# Patient Record
Sex: Female | Born: 2000 | Race: Black or African American | Hispanic: No | Marital: Single | Smoking: Never smoker
Health system: Southern US, Community
[De-identification: ages and names within clinical notes are randomized; demographics above are authoritative.]

## PROBLEM LIST (undated history)

## (undated) DIAGNOSIS — O99013 Anemia complicating pregnancy, third trimester: Secondary | ICD-10-CM

## (undated) DIAGNOSIS — Z8759 Personal history of other complications of pregnancy, childbirth and the puerperium: Secondary | ICD-10-CM

## (undated) DIAGNOSIS — R000 Tachycardia, unspecified: Secondary | ICD-10-CM

## (undated) DIAGNOSIS — F909 Attention-deficit hyperactivity disorder, unspecified type: Secondary | ICD-10-CM

## (undated) DIAGNOSIS — E301 Precocious puberty: Secondary | ICD-10-CM

## (undated) DIAGNOSIS — F4310 Post-traumatic stress disorder, unspecified: Secondary | ICD-10-CM

## (undated) HISTORY — DX: Personal history of other complications of pregnancy, childbirth and the puerperium: Z87.59

## (undated) HISTORY — DX: Post-traumatic stress disorder, unspecified: F43.10

## (undated) HISTORY — PX: WISDOM TOOTH EXTRACTION: SHX21

## (undated) HISTORY — DX: Attention-deficit hyperactivity disorder, unspecified type: F90.9

## (undated) HISTORY — DX: Precocious puberty: E30.1

---
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
## 2020-02-07 ENCOUNTER — Ambulatory Visit: Payer: Medicaid Other

## 2020-02-18 ENCOUNTER — Encounter: Payer: Self-pay | Admitting: Family Medicine

## 2020-02-18 ENCOUNTER — Other Ambulatory Visit: Payer: Self-pay

## 2020-02-18 ENCOUNTER — Ambulatory Visit: Payer: Medicaid Other | Admitting: Family Medicine

## 2020-02-18 DIAGNOSIS — Z113 Encounter for screening for infections with a predominantly sexual mode of transmission: Secondary | ICD-10-CM

## 2020-02-18 DIAGNOSIS — N760 Acute vaginitis: Secondary | ICD-10-CM | POA: Diagnosis not present

## 2020-02-18 DIAGNOSIS — B9689 Other specified bacterial agents as the cause of diseases classified elsewhere: Secondary | ICD-10-CM

## 2020-02-18 LAB — WET PREP FOR TRICH, YEAST, CLUE
Trichomonas Exam: NEGATIVE
Yeast Exam: NEGATIVE

## 2020-02-18 MED ORDER — METRONIDAZOLE 500 MG PO TABS: 500.0000 mg | ORAL_TABLET | Freq: Two times a day (BID) | ORAL | 0 refills | Status: AC

## 2020-02-18 NOTE — Progress Notes (Signed)
[facility] Department STI clinic/screening visit  Subjective:  Denise [name] is a 19 y.o. female being seen today for an STI screening visit. The patient reports they do not have symptoms.  Patient reports that they do not desire a pregnancy in the next year.   They reported they are not interested in discussing contraception today.  Patient's last menstrual period was 02/10/2020 (exact date).   Patient has the following medical conditions:   Patient Active Problem List   Diagnosis Date Noted  . Elevated blood pressure reading without diagnosis of hypertension 09/18/2018  . Post traumatic stress disorder (PTSD) 02/20/2014  . 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 ADHD (attention deficit hyperactivity disorder), 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    Chief Complaint  Patient presents with  . SEXUALLY TRANSMITTED DISEASE    screening     HPI  Patient reports wanting to be checked to make sure that previous infection is gone.    Last HIV test per patient/review of record was 08/21/2019 No previous pap for patient d/t age.  Discussed age of first pap should be at 52.  .   See flowsheet for further details and programmatic requirements.    The following portions of the patient's history were reviewed and updated as appropriate: allergies, current medications, past medical history, past social history, past surgical history and problem list.  Objective:  There were no vitals filed for this visit.  Physical Exam Constitutional:      Appearance: She is obese.  HENT:     Head: Normocephalic.     Mouth/Throat:     Mouth: Mucous membranes are moist.     Pharynx: Oropharynx is clear.  Abdominal:     Palpations: Abdomen is soft. There is no mass.     Tenderness: There is no abdominal tenderness. There is no guarding.  Genitourinary:    Comments: External genitalia without, lice, nits, erythema, edema , lesions or inguinal adenopathy. Vagina with normal  mucosa and discharge and pH equals 4.  Cervix without visual lesions, uterus firm, mobile, non-tender, no masses, CMT adnexal fullness or tenderness.  Musculoskeletal:     Cervical back: Normal range of motion and neck supple.  Lymphadenopathy:     Cervical: No cervical adenopathy.  Skin:    General: Skin is warm and dry.     Findings: No erythema, lesion or rash.  Neurological:     Mental Status: She is alert.  Psychiatric:        Mood and Affect: Mood normal.        Behavior: Behavior normal.      Assessment and Plan:  Denise [name] is a 19 y.o. female presenting to the [facility] Department for STI screening  1. Screening examination for venereal disease  - Chlamydia/Gonorrhea [place] [facility] - WET PREP FOR TRICH, YEAST, CLUE - Chlamydia/Gonorrhea [place] [facility]  Patient accepted screenings including wet prep, oral and vaginal CT/GC NAAT.   Patient meets criteria for HepB screening? No. Ordered? No - N/A Patient meets criteria for HepC screening? No. Ordered? No - N/A  Treat wet prep per standing order with metronidazole 500 mg PO BID x 7 days.  Discussed time line for [facility] results and that patient will be called with positive results and encouraged patient to call if she had not heard in 2 weeks.  Counseled to return or seek care for continued or worsening symptoms Recommended condom use with all sex  Patient is currently using condoms  to prevent pregnancy and is not interested in birth control.      No follow-ups on file.  No future appointments.  Wendi Snipes, RN

## 2020-03-01 NOTE — Progress Notes (Signed)
Reviewed ERRN documentation for flowsheet and exam note.  Agree that documentation meets STD program requirements/guidelines and standing orders.  Will sign since [facility] is not cleared to sign documents yet.

## 2020-06-26 ENCOUNTER — Other Ambulatory Visit: Payer: Self-pay

## 2020-06-26 ENCOUNTER — Ambulatory Visit: Payer: Medicaid Other | Admitting: Physician Assistant

## 2020-06-26 DIAGNOSIS — Z113 Encounter for screening for infections with a predominantly sexual mode of transmission: Secondary | ICD-10-CM

## 2020-06-26 LAB — WET PREP FOR TRICH, YEAST, CLUE
Trichomonas Exam: NEGATIVE
Yeast Exam: NEGATIVE

## 2020-06-26 NOTE — Progress Notes (Unsigned)
Wet mount reviewed with provider and no treatment needed per standing order and per provider verbal order as pt reports not having any symptoms. Upon posting pt reports she had some white discharge but no other symptoms, so consulted with Sadie Haber, PA who states that white discharge can be normal and that sometimes hormonal changes prior to getting ready to start your menstrual period could cause that as well but that no treatment needed at this time. Counseled pt per Sadie Haber, PA and pt states understanding. Counseled pt that if any abnormal symptoms develop or if discharge increases or doesn't go away to let us know so she can RTC, and pt states understanding. Awaiting TR's. Provider orders completed.

## 2020-06-27 ENCOUNTER — Encounter: Payer: Self-pay | Admitting: Physician Assistant

## 2020-06-27 NOTE — Progress Notes (Signed)
  [facility] Department STI clinic/screening visit  Subjective:  Denise [name] is a 20 y.o. female being seen today for an STI screening visit. The patient reports they do not have symptoms.  Patient reports that they do not desire a pregnancy in the next year.   They reported they are not interested in discussing contraception today.  Patient's last menstrual period was 05/31/2020.   Patient has the following medical conditions:   Patient Active Problem List   Diagnosis Date Noted  . Elevated blood pressure reading without diagnosis of hypertension 09/18/2018  . Post traumatic stress disorder (PTSD) 02/20/2014  . 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 ADHD (attention deficit hyperactivity disorder), 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    Chief Complaint  Patient presents with  . SEXUALLY TRANSMITTED DISEASE    screening    HPI  Patient reports that she is not having any symptoms but would like a screening today.  Denies surgeries and regular medicines.  Reports history of ADHD and PTSD but denies any current symptoms or medications.  States last HIV test was in 2021 and has not had a pap since she is not yet 20 years old.  See flowsheet for further details and programmatic requirements.    The following portions of the patient's history were reviewed and updated as appropriate: allergies, current medications, past medical history, past social history, past surgical history and problem list.  Objective:  There were no vitals filed for this visit.  Physical Exam Constitutional:      General: She is not in acute distress.    Appearance: Normal appearance.  HENT:     Head: Normocephalic and atraumatic.  Eyes:     Conjunctiva/sclera: Conjunctivae normal.  Pulmonary:     Effort: Pulmonary effort is normal.  Skin:    General: Skin is warm and dry.     Findings: No bruising, erythema, lesion or rash.  Neurological:     Mental Status: She is alert and  oriented to person, place, and time.  Psychiatric:        Mood and Affect: Mood normal.        Behavior: Behavior normal.        Thought Content: Thought content normal.        Judgment: Judgment normal.      Assessment and Plan:  BLESS LISENBY is a 20 y.o. female presenting to the [facility] Department for STI screening  1. Screening for STD (sexually transmitted disease) Patient into clinic without symptoms. Patient declines screening exam by provider and opts to self-collect vaginal samples.  Counseled patient how to collect for accurate results. Rec condoms with all sex. Await test results.  Counseled that RN will call if needs to RTC for treatment once results are back. - WET PREP FOR TRICH, YEAST, CLUE - Chlamydia/Gonorrhea [facility] - HIV [place] [facility] - Syphilis Serology, [facility]     Return if symptoms worsen or fail to improve.  No future appointments.  Matt Holmes, PA

## 2020-07-03 ENCOUNTER — Encounter: Payer: Self-pay | Admitting: Student

## 2020-07-03 LAB — HM HIV SCREENING LAB: HM HIV Screening: NEGATIVE

## 2020-08-15 ENCOUNTER — Ambulatory Visit
Admission: EM | Admit: 2020-08-15 | Discharge: 2020-08-15 | Disposition: A | Discharge status: Home / Self Care | Payer: Medicaid Other | Attending: Emergency Medicine | Admitting: Emergency Medicine

## 2020-08-15 ENCOUNTER — Other Ambulatory Visit: Payer: Self-pay

## 2020-08-15 ENCOUNTER — Telehealth: Payer: Self-pay

## 2020-08-15 DIAGNOSIS — N760 Acute vaginitis: Secondary | ICD-10-CM

## 2020-08-15 MED ORDER — NYSTATIN-TRIAMCINOLONE 100000-0.1 UNIT/GM-% EX CREA: TOPICAL_CREAM | CUTANEOUS | 0 refills | Status: DC

## 2020-08-15 NOTE — ED Triage Notes (Signed)
Pt c/o milky white discharge with fishy odor for approx 1 month, vaginal irritation and itching for past two weeks Was evaluated and tested at health dept approx 12 days ago and reports all testing was negative. Had blood drawn for HIV, syphilis (negative).   Has been using boric acid and monistat for approx 1 week. Improved symptoms, but stopped boric acid yesterday 2/2 bleeding. Denies abdominal pain, fever, n/v/d

## 2020-08-15 NOTE — ED Provider Notes (Signed)
[name]    CSN: [id number] Arrival date & time: 08/15/20  1140      History   Chief Complaint Chief Complaint  Patient presents with  . Vaginal Discharge    HPI Denise [name] is a 20 y.o. female.   Denise [name] is a 20 year old female with complaint of vaginal discharge and odor for about 1 month.  Patient reports that she was tested for STIs about 2 weeks ago and results were negative. Patient reports using some OTC fungal cream and Boric suppository and was able to get symptom relief.  Patient reports smell has resolved but still having some itching and irritation.   The history is provided by the patient.  Vaginal Discharge Associated symptoms: no abdominal pain, no fever, no nausea and no vomiting     Past Medical History:  Diagnosis Date  . Premature baby    born at 68 weeks  . Premature puberty    mother started menses at 64yrs.    Patient Active Problem List   Diagnosis Date Noted  . Elevated blood pressure reading without diagnosis of hypertension 09/18/2018  . Post traumatic stress disorder (PTSD) 02/20/2014  . 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 ADHD (attention deficit hyperactivity disorder), 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    History reviewed. No pertinent surgical history.  OB History   No obstetric history on file.      Home Medications    Prior to Admission medications   Medication Sig Start Date End Date Taking? Authorizing Provider  nystatin-triamcinolone (MYCOLOG II) cream Apply to affected area once or twice a day as needed 08/15/20  Yes Ivette Loyal, NP    Family History Family History  Problem Relation Age of Onset  . Bipolar disorder Mother   . Enuresis Sister        nocturnal    Social History Social History   Tobacco Use  . Smoking status: Never Smoker  . Smokeless tobacco: Never Used  Vaping Use  . Vaping Use: Never used  Substance Use Topics  . Alcohol use: Yes    Alcohol/week:  2.0 standard drinks    Types: 2 Shots of liquor per week    Comment: every now and then   . Drug use: No     Allergies   Peanut-containing drug products   Review of Systems Review of Systems  Constitutional: Negative for fever.  Gastrointestinal: Negative for abdominal pain, nausea and vomiting.  Genitourinary: Positive for vaginal discharge. Negative for flank pain, genital sores, hematuria, pelvic pain and urgency.     Physical Exam Triage Vital Signs ED Triage Vitals  Enc Vitals Group     BP 08/15/20 1157 127/86     Pulse Rate 08/15/20 1157 (!) 106     Resp 08/15/20 1157 18     Temp 08/15/20 1157 99.3 F (37.4 C)     Temp Source 08/15/20 1157 Oral     SpO2 08/15/20 1157 97 %     Weight --      Height --      Head Circumference --      Peak Flow --      Pain Score 08/15/20 1158 0     Pain Loc --      Pain Edu? --      Excl. in GC? --    No data found.  Updated Vital Signs BP 127/86 (BP Location: Left Arm)   Pulse (!) 106   Temp 99.3  F (37.4 C) (Oral)   Resp 18   LMP 07/26/2020   SpO2 97%   Visual Acuity Right Eye Distance:   Left Eye Distance:   Bilateral Distance:    Right Eye Near:   Left Eye Near:    Bilateral Near:     Physical Exam Vitals and nursing note reviewed.  Constitutional:      General: She is not in acute distress.    Appearance: She is not ill-appearing or diaphoretic.  HENT:     Head: Normocephalic and atraumatic.  Eyes:     Conjunctiva/sclera: Conjunctivae normal.  Cardiovascular:     Rate and Rhythm: Normal rate.     Pulses: Normal pulses.  Pulmonary:     Effort: Pulmonary effort is normal.  Abdominal:     Palpations: Abdomen is soft.     Tenderness: There is no right CVA tenderness or left CVA tenderness.  Musculoskeletal:        General: Normal range of motion.     Cervical back: Normal range of motion.  Skin:    General: Skin is warm and dry.  Neurological:     Mental Status: She is alert and oriented to  person, place, and time.  Psychiatric:        Mood and Affect: Mood normal.      UC Treatments / Results  Labs (all labs ordered are listed, but only abnormal results are displayed) Labs Reviewed  CERVICOVAGINAL ANCILLARY ONLY    EKG   Radiology No results found.  Procedures Procedures (including critical care time)  Medications Ordered in UC Medications - No data to display  Initial Impression / Assessment and Plan / UC Course  I have reviewed the triage vital signs and the nursing notes.  Pertinent labs & imaging results that were available during my care of the patient were reviewed by me and considered in my medical decision making (see chart for details).     Vaginitis.   Assessment negative for red flags. Self vaginal swab pending.  Will treat based on results.   Mycolog cream as needed for symptom management.    Final Clinical Impressions(s) / UC Diagnoses   Final diagnoses:  Vaginitis and vulvovaginitis     Discharge Instructions     You have vaginitis.   We will contact you if your labs come back positive.   You may use Mycolog cream as needed for itch and irritation.   Return or go to the Emergency Department if symptoms worsen or do not improve in the next few days.      ED Prescriptions    Medication Sig Dispense Auth. Provider   nystatin-triamcinolone (MYCOLOG II) cream Apply to affected area once or twice a day as needed 15 g Ivette Loyal, NP     PDMP not reviewed this encounter.   Ivette Loyal, NP 08/15/20 1227

## 2020-08-15 NOTE — Telephone Encounter (Signed)
Pharmacy requested prior auth for mycolog cream. Per Karie Schwalbe, NP, pharmacy to dispense nystatin cream and triamcinolone creams separately and apply both once per day. New Rx info given to [facility] of [facility] [address]

## 2020-08-15 NOTE — Discharge Instructions (Addendum)
You have vaginitis.   We will contact you if your labs come back positive.   You may use Mycolog cream as needed for itch and irritation.   Return or go to the Emergency Department if symptoms worsen or do not improve in the next few days.

## 2020-08-18 ENCOUNTER — Ambulatory Visit: Payer: Medicaid Other

## 2020-08-18 ENCOUNTER — Telehealth: Payer: Self-pay

## 2020-08-18 ENCOUNTER — Telehealth (HOSPITAL_COMMUNITY): Payer: Self-pay | Admitting: Emergency Medicine

## 2020-08-18 LAB — CERVICOVAGINAL ANCILLARY ONLY
Bacterial Vaginitis (gardnerella): POSITIVE — AB
Candida Glabrata: NEGATIVE
Candida Vaginitis: NEGATIVE
Chlamydia: NEGATIVE
Comment: NEGATIVE
Comment: NEGATIVE
Comment: NEGATIVE
Comment: NEGATIVE
Comment: NEGATIVE
Comment: NORMAL
Neisseria Gonorrhea: NEGATIVE
Trichomonas: NEGATIVE

## 2020-08-18 MED ORDER — METRONIDAZOLE 500 MG PO TABS: 500.0000 mg | ORAL_TABLET | Freq: Two times a day (BID) | ORAL | 0 refills | Status: DC

## 2020-08-18 MED ORDER — FLUCONAZOLE 150 MG PO TABS: ORAL_TABLET | ORAL | 0 refills | Status: DC

## 2020-08-18 NOTE — Telephone Encounter (Signed)
Pt called for results cyto swab. Notified pt she is positive for BV and we can send Rx to pharmacy. Pt also requested Diflucan 2/2 prone to yeast infection with abx.   Per T. Jaci Lazier, NP, Rx sent to [facility] [address] [employer]

## 2020-09-15 ENCOUNTER — Ambulatory Visit: Payer: Medicaid Other

## 2020-10-23 ENCOUNTER — Encounter: Payer: Self-pay | Admitting: Emergency Medicine

## 2020-10-23 ENCOUNTER — Ambulatory Visit
Admission: EM | Admit: 2020-10-23 | Discharge: 2020-10-23 | Disposition: A | Discharge status: Home / Self Care | Payer: Medicaid Other

## 2020-10-23 ENCOUNTER — Other Ambulatory Visit: Payer: Self-pay

## 2020-10-23 DIAGNOSIS — B349 Viral infection, unspecified: Secondary | ICD-10-CM

## 2020-10-23 MED ORDER — BENZONATATE 100 MG PO CAPS: 100.0000 mg | ORAL_CAPSULE | Freq: Three times a day (TID) | ORAL | 0 refills | Status: DC | PRN

## 2020-10-23 NOTE — ED Provider Notes (Signed)
[name]    CSN: [id number] Arrival date & time: 10/23/20  1416      History   Chief Complaint Chief Complaint  Patient presents with  . Cough    HPI Denise [name] is a 20 y.o. female.   Patient presents with 1 week history of congestion, postnasal drip, runny nose, headache, nonproductive cough.  She also reports chills at the onset of her symptoms and believes she had a fever but she did not take her temperature with a thermometer.  She also reports intermittent diarrhea over the past week; 3 episodes today.  She denies rash, shortness of breath, vomiting, or other symptoms.  No treatments attempted at home.  Her medical history includes ADHD, conduct disorder, PTSD, elevated blood pressure reading.  The history is provided by the patient and medical records.    Past Medical History:  Diagnosis Date  . Premature baby    born at 102 weeks  . Premature puberty    mother started menses at 62yrs.    Patient Active Problem List   Diagnosis Date Noted  . Elevated blood pressure reading without diagnosis of hypertension 09/18/2018  . Post traumatic stress disorder (PTSD) 02/20/2014  . 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 ADHD (attention deficit hyperactivity disorder), 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    History reviewed. No pertinent surgical history.  OB History   No obstetric history on file.      Home Medications    Prior to Admission medications   Medication Sig Start Date End Date Taking? Authorizing Provider  benzonatate (TESSALON) 100 MG capsule Take 1 capsule (100 mg total) by mouth 3 (three) times daily as needed for cough. 10/23/20  Yes Mickie Bail, NP  guaiFENesin (MUCINEX) 600 MG 12 hr tablet Take by mouth 2 (two) times daily.   Yes [provider]  fluconazole (DIFLUCAN) 150 MG tablet Take one tablet today, then repeat in 7 days Patient not taking: Reported on 10/23/2020 08/18/20   Dahlia Byes A, NP  metroNIDAZOLE (FLAGYL)  500 MG tablet Take 1 tablet (500 mg total) by mouth 2 (two) times daily. Patient not taking: Reported on 10/23/2020 08/18/20   Janace Aris, NP  nystatin-triamcinolone (MYCOLOG II) cream Apply to affected area once or twice a day as needed Patient not taking: Reported on 10/23/2020 08/15/20   Ivette Loyal, NP    Family History Family History  Problem Relation Age of Onset  . Bipolar disorder Mother   . Enuresis Sister        nocturnal    Social History Social History   Tobacco Use  . Smoking status: Never Smoker  . Smokeless tobacco: Never Used  Vaping Use  . Vaping Use: Never used  Substance Use Topics  . Alcohol use: Yes    Alcohol/week: 2.0 standard drinks    Types: 2 Shots of liquor per week    Comment: every now and then   . Drug use: No     Allergies   Peanut-containing drug products   Review of Systems Review of Systems  Constitutional: Positive for chills and fever.  HENT: Positive for congestion, postnasal drip and rhinorrhea. Negative for ear pain and sore throat.   Respiratory: Positive for cough. Negative for shortness of breath.   Cardiovascular: Negative for chest pain and palpitations.  Gastrointestinal: Positive for diarrhea. Negative for abdominal pain, nausea and vomiting.  Musculoskeletal: Negative for arthralgias and back pain.  Skin: Negative for color change and rash.  Neurological: Positive for headaches. Negative for dizziness, syncope, weakness and numbness.  All other systems reviewed and are negative.    Physical Exam Triage Vital Signs ED Triage Vitals  Enc Vitals Group     BP      Pulse      Resp      Temp      Temp src      SpO2      Weight      Height      Head Circumference      Peak Flow      Pain Score      Pain Loc      Pain Edu?      Excl. in GC?    No data found.  Updated Vital Signs BP 137/87 (BP Location: Left Arm)   Pulse 91   Temp 98.9 F (37.2 C) (Oral)   Resp 20   LMP 10/17/2020   SpO2 96%   Visual  Acuity Right Eye Distance:   Left Eye Distance:   Bilateral Distance:    Right Eye Near:   Left Eye Near:    Bilateral Near:     Physical Exam Vitals and nursing note reviewed.  Constitutional:      General: She is not in acute distress.    Appearance: She is well-developed. She is not ill-appearing.  HENT:     Head: Normocephalic and atraumatic.     Right Ear: Tympanic membrane normal.     Left Ear: Tympanic membrane normal.     Nose: Nose normal.     Mouth/Throat:     Mouth: Mucous membranes are moist.     Pharynx: Oropharynx is clear.  Eyes:     Conjunctiva/sclera: Conjunctivae normal.  Cardiovascular:     Rate and Rhythm: Normal rate and regular rhythm.     Heart sounds: Normal heart sounds.  Pulmonary:     Effort: Pulmonary effort is normal. No respiratory distress.     Breath sounds: Normal breath sounds.  Abdominal:     Palpations: Abdomen is soft.     Tenderness: There is no abdominal tenderness. There is no guarding or rebound.  Musculoskeletal:     Cervical back: Neck supple.  Skin:    General: Skin is warm and dry.  Neurological:     General: No focal deficit present.     Mental Status: She is alert and oriented to person, place, and time.  Psychiatric:        Mood and Affect: Mood normal.        Behavior: Behavior normal.      UC Treatments / Results  Labs (all labs ordered are listed, but only abnormal results are displayed) Labs Reviewed  NOVEL CORONAVIRUS, NAA    EKG   Radiology No results found.  Procedures Procedures (including critical care time)  Medications Ordered in UC Medications - No data to display  Initial Impression / Assessment and Plan / UC Course  I have reviewed the triage vital signs and the nursing notes.  Pertinent labs & imaging results that were available during my care of the patient were reviewed by me and considered in my medical decision making (see chart for details).   Viral illness.  COVID pending.   Instructed patient to self quarantine until the test result is back.  Discussed symptomatic treatment including Tessalon as needed for cough, Tylenol or ibuprofen as needed for fever or discomfort, rest, hydration.  Instructed patient to follow up with  PCP if her symptoms are not improving.  Patient agrees to plan of care.    Final Clinical Impressions(s) / UC Diagnoses   Final diagnoses:  Viral illness     Discharge Instructions     Your COVID test is pending.  You should self quarantine until the test result is back.    Take Tessalon Perles as needed for cough.  Take Tylenol or ibuprofen as needed for fever or discomfort.  Rest and keep yourself hydrated.    Follow-up with your primary care provider if your symptoms are not improving.        ED Prescriptions    Medication Sig Dispense Auth. Provider   benzonatate (TESSALON) 100 MG capsule Take 1 capsule (100 mg total) by mouth 3 (three) times daily as needed for cough. 21 capsule Mickie Bail, NP     PDMP not reviewed this encounter.   Mickie Bail, NP 10/23/20 [phone]

## 2020-10-23 NOTE — Discharge Instructions (Signed)
Your COVID test is pending.  You should self quarantine until the test result is back.    Take Tessalon Perles as needed for cough.  Take Tylenol or ibuprofen as needed for fever or discomfort.  Rest and keep yourself hydrated.    Follow-up with your primary care provider if your symptoms are not improving.

## 2020-10-23 NOTE — ED Triage Notes (Signed)
Patient says symptoms started one week ago.  Complains of cough, congestion, headache, and initially had a fever

## 2020-10-24 LAB — SARS-COV-2, NAA 2 DAY TAT

## 2020-10-24 LAB — NOVEL CORONAVIRUS, NAA: SARS-CoV-2, NAA: DETECTED — AB

## 2021-01-06 ENCOUNTER — Other Ambulatory Visit: Payer: Self-pay

## 2021-01-06 ENCOUNTER — Ambulatory Visit
Admission: EM | Admit: 2021-01-06 | Discharge: 2021-01-06 | Disposition: A | Discharge status: Home / Self Care | Payer: Medicaid Other | Attending: Emergency Medicine | Admitting: Emergency Medicine

## 2021-01-06 ENCOUNTER — Encounter: Payer: Self-pay | Admitting: Emergency Medicine

## 2021-01-06 DIAGNOSIS — L02215 Cutaneous abscess of perineum: Secondary | ICD-10-CM

## 2021-01-06 MED ORDER — DOXYCYCLINE HYCLATE 100 MG PO CAPS: 100.0000 mg | ORAL_CAPSULE | Freq: Two times a day (BID) | ORAL | 0 refills | Status: AC

## 2021-01-06 NOTE — Discharge Instructions (Addendum)
Take antibiotic with food as prescribed. Apply warm compresses to promote drainage. Warm Epsom salt soaks as discussed. Keep covered until wound closes. Expect it to gradually improve over 7 - 10 days.  Watch for signs of worsening infection: increased redness, swelling, red streaking, fever, or worsening pain. If these symptoms are present, or if you have new or concerning symptoms, return to clinic immediately for a recheck.

## 2021-01-06 NOTE — ED Provider Notes (Signed)
Chief Complaint   Chief Complaint  Patient presents with   Rash     Subjective, HPI  QUANIYAH BUGH is a very pleasant 20 y.o. female who presents with "bump" to her mid buttock region for the last 3 days.  Patient reports that she recently changed soaps and has noticed some burning in the area.  Patient reports some soreness and the appearance of "yellow at the top".  Patient states that at times it is a bit painful to sit.  She denies any fever, vomiting, chills.  She does not report any new sexual partners or vaginal discharge.  History obtained from patient.   Patient's problem list, past medical and social history, medications, and allergies were reviewed by me and updated in Epic.    ROS  See HPI.  Objective   Vitals:   01/06/21 0926  BP: 113/77  Pulse: 92  Resp: 18  Temp: 98.8 F (37.1 C)  SpO2: 98%    Vital signs and nursing note reviewed.  General: Appears well-developed and well-nourished. No acute distress.  HEENT: Normocephalic, atraumatic, hearing grossly intact. EOMI, no drainage. No rhinorrhea. Moist mucous membranes.  Neck: Normal range of motion, neck is supple.  Cardiovascular: Normal rate.  Pulm/Chest: No respiratory distress.   Musculoskeletal: No joint deformity, normal range of motion.  Skin: Superficial small abscess noted to perineum.  Mild TTP over area.  No induration.  Data  No results found for any visits on 01/06/21.   Assessment & Plan  1. Perineal abscess, superficial  Meds ordered this encounter  Medications   doxycycline (VIBRAMYCIN) 100 MG capsule    Sig: Take 1 capsule (100 mg total) by mouth 2 (two) times daily for 10 days.    Dispense:  20 capsule    Refill:  0    Order Specific Question:   Supervising Provider    Answer:   Merrilee Jansky [[id number]]     20 y.o. female presents with "bump" to her mid buttock region for the last 3 days.  Patient reports that she recently changed soaps and has noticed some burning in the  area.  Patient reports some soreness and the appearance of "yellow at the top".  Patient states that at times it is a bit painful to sit.  She denies any fever, vomiting, chills.  She does not report any new sexual partners or vaginal discharge.  Chart review completed.  Given symptoms along with assessment findings, likely small abscess to the perineum.  Advised about home treatment and care to include Epsom salt soaks and warm compresses.  Also Rx doxycycline to the patient's preferred pharmacy.  Advised that if area does not improve or acutely worsens over the next 7 to 10 days she will need to return to clinic for recheck.  Patient verbalized understanding and agreed with plan.  Patient stable upon discharge.  Return as needed.  Plan:   Discharge Instructions      Take antibiotic with food as prescribed. Apply warm compresses to promote drainage. Warm Epsom salt soaks as discussed. Keep covered until wound closes. Expect it to gradually improve over 7 - 10 days.  Watch for signs of worsening infection: increased redness, swelling, red streaking, fever, or worsening pain. If these symptoms are present, or if you have new or concerning symptoms, return to clinic immediately for a recheck.        Amalia Greenhouse, FNP-C 01/06/21  This note was partially made with the aid of speech-to-text dictation; typographical  errors are not intentional.    Amalia Greenhouse, [place] 01/06/21 [phone]

## 2021-01-06 NOTE — ED Triage Notes (Signed)
Pt presents today with c/o of rash to mid buttocks x 3 days. Denies fever.

## 2021-02-09 ENCOUNTER — Ambulatory Visit: Payer: Medicaid Other

## 2021-03-03 ENCOUNTER — Encounter: Payer: Self-pay | Admitting: Advanced Practice Midwife

## 2021-03-03 ENCOUNTER — Ambulatory Visit: Payer: Medicaid Other | Admitting: Advanced Practice Midwife

## 2021-03-03 ENCOUNTER — Other Ambulatory Visit: Payer: Self-pay

## 2021-03-03 DIAGNOSIS — Z113 Encounter for screening for infections with a predominantly sexual mode of transmission: Secondary | ICD-10-CM | POA: Diagnosis not present

## 2021-03-03 LAB — WET PREP FOR TRICH, YEAST, CLUE
Trichomonas Exam: NEGATIVE
Yeast Exam: NEGATIVE

## 2021-03-03 NOTE — Progress Notes (Signed)
[facility] Department STI clinic/screening visit  Subjective:  Denise [name] is a 20 y.o. SBF nullip nonsmoker female being seen today for an STI screening visit. The patient reports they do not have symptoms.  Patient reports that they do not desire a pregnancy in the next year.   They reported they are not interested in discussing contraception today.  No LMP recorded.   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 Last sex 02/26/21 without condom; with current partner x 1.5 years; 1 partner in last 3 mo. LMP 02/27/21. Last ETOH 02/23/21 (1 wine cooler) q 3 mo.  Last HIV test per patient/review of record was 07/03/20 Patient reports last pap was never  See flowsheet for further details and programmatic requirements.    The following portions of the patient's history were reviewed and updated as appropriate: allergies, current medications, past medical history, past social history, past surgical history and problem list.  Objective:  There were no vitals filed for this visit.  Physical Exam Vitals and nursing note reviewed.  Constitutional:      Appearance: Normal appearance. She is obese.  HENT:     Head: Normocephalic and atraumatic.     Mouth/Throat:     Mouth: Mucous membranes are moist.     Pharynx: Oropharynx is clear. No oropharyngeal exudate or posterior oropharyngeal erythema.  Eyes:     Conjunctiva/sclera: Conjunctivae normal.  Pulmonary:     Effort: Pulmonary effort is normal.   Abdominal:     Palpations: Abdomen is soft. There is no mass.     Tenderness: There is no abdominal tenderness. There is no rebound.     Comments: Increased adipose, poor tone, soft without masses or tenderness  Genitourinary:    General: Normal vulva.     Exam position: Lithotomy position.     Pubic Area: No rash or pubic lice.      Labia:        Right: No rash or lesion.        Left: No rash or lesion.      Vagina: Vaginal discharge (pink leukorrhea, ph>4.5) present. No erythema, bleeding or lesions.     Cervix: Normal.     Rectum: Normal.  Lymphadenopathy:     Head:     Right side of head: No preauricular or posterior auricular adenopathy.     Left side of head: No preauricular or posterior auricular adenopathy.     Cervical: No cervical adenopathy.     Right cervical: No superficial, deep or posterior cervical adenopathy.    Left cervical: No superficial, deep or posterior cervical adenopathy.     Upper Body:     Right upper body: No supraclavicular or axillary adenopathy.     Left upper body: No supraclavicular or axillary adenopathy.     Lower Body: No right inguinal adenopathy. No left inguinal adenopathy.  Skin:    General: Skin is warm and dry.     Findings: No rash.  Neurological:     Mental Status: She is alert and oriented to  person, place, and time.     Assessment and Plan:  HEILEY SHAIKH is a 20 y.o. female presenting to the [facility] Department for STI screening  1. Screening examination for venereal disease Treat wet mount per standing orders Immunization nurse consult - WET PREP FOR TRICH, YEAST, CLUE - Gonococcus culture - Chlamydia/Gonorrhea [facility] - Syphilis Serology, [facility] - HIV  LAB  2. Morbid obesity (HCC)      No follow-ups on file.  No future appointments.  Alberteen Spindle, CNM

## 2021-03-03 NOTE — Progress Notes (Signed)
Patient here for STD testing. Wet mount reviewed, no tx per standing orders. Declined condoms.

## 2021-03-08 LAB — GONOCOCCUS CULTURE

## 2021-03-16 ENCOUNTER — Ambulatory Visit: Payer: Medicaid Other

## 2021-03-27 ENCOUNTER — Encounter: Payer: Self-pay | Admitting: Emergency Medicine

## 2021-03-27 ENCOUNTER — Ambulatory Visit
Admission: EM | Admit: 2021-03-27 | Discharge: 2021-03-27 | Disposition: A | Discharge status: Home / Self Care | Payer: Medicaid Other | Attending: Emergency Medicine | Admitting: Emergency Medicine

## 2021-03-27 ENCOUNTER — Other Ambulatory Visit: Payer: Self-pay

## 2021-03-27 DIAGNOSIS — Z113 Encounter for screening for infections with a predominantly sexual mode of transmission: Secondary | ICD-10-CM | POA: Insufficient documentation

## 2021-03-27 DIAGNOSIS — N898 Other specified noninflammatory disorders of vagina: Secondary | ICD-10-CM | POA: Diagnosis present

## 2021-03-27 MED ORDER — METRONIDAZOLE 500 MG PO TABS: 500.0000 mg | ORAL_TABLET | Freq: Two times a day (BID) | ORAL | 0 refills | Status: DC

## 2021-03-27 NOTE — Discharge Instructions (Addendum)
Take metronidazole twice a day for 7 days.   ? ?Your vaginal tests are pending.  If your test results are positive, we will call you.  Do not have sexual activity for at least 7 days.   ? ?Follow up with your primary care provider if your symptoms are not improving.   ? ?

## 2021-03-27 NOTE — ED Triage Notes (Signed)
Vaginal itching, irritation, and foul odor x 1 week

## 2021-03-27 NOTE — ED Provider Notes (Signed)
[name]    CSN: [id number] Arrival date & time: 03/27/21  1606      History   Chief Complaint Chief Complaint  Patient presents with   Vaginal Itching     HPI Denise [name] is a 20 y.o. female.  Patient presents with 1 week history of malodorous vaginal discharge. She also reports occasional vaginal itching and vaginal irritation.  She states her symptoms are similar to previous episodes of bacterial vaginitis.  She denies fever, chills, rash, lesions, abdominal pain, dysuria, hematuria, pelvic pain, flank pain, or other symptoms.  No treatments at home.  Patient tested negative for gonorrhea, chlamydia, HIV, and syphilis on 03/03/2021.  Her medical history includes morbid obesity, PTSD, conduct disorder, ADHD.  Patient denies pregnancy or breast-feeding.  The history is provided by the patient and medical records.   Past Medical History:  Diagnosis Date   Premature baby    born at 45 weeks   Premature puberty    mother started menses at 47yrs.    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  OB History   No obstetric history on file.      Home Medications    Prior to Admission medications   Medication Sig Start Date End Date Taking? Authorizing Provider  metroNIDAZOLE (FLAGYL) 500 MG tablet Take 1 tablet (500 mg total) by mouth 2 (two) times daily. 03/27/21  Yes Mickie Bail, NP  benzonatate (TESSALON) 100 MG capsule Take 1 capsule (100 mg total) by mouth 3 (three) times daily as needed for cough. 10/23/20   Mickie Bail, NP  guaiFENesin (MUCINEX) 600 MG 12 hr tablet Take by mouth 2 (two) times daily.     [provider]    Family History Family History  Problem Relation Age of Onset   Bipolar disorder Mother    Enuresis Sister        nocturnal    Social History Social History   Tobacco Use   Smoking status: Never   Smokeless tobacco: Never  Vaping Use   Vaping Use: Never used  Substance Use Topics   Alcohol use: Yes    Alcohol/week: 2.0 standard drinks    Types: 2 Shots of liquor per week    Comment: last use 02/23/21 q 3 mo   Drug use: No     Allergies   Peanut-containing drug products   Review of Systems Review of Systems  Constitutional:  Negative for chills and fever.  Respiratory:  Negative for cough and shortness of breath.   Cardiovascular:  Negative for chest pain and palpitations.  Gastrointestinal:  Negative for abdominal pain and vomiting.  Genitourinary:  Positive for vaginal discharge. Negative for dysuria, flank pain, hematuria and pelvic pain.  Skin:  Negative for color change and rash.  All other systems reviewed and are negative.   Physical Exam Triage Vital Signs ED Triage Vitals  Enc Vitals Group     BP      Pulse      Resp      Temp      Temp src      SpO2      Weight  Height      Head Circumference      Peak Flow      Pain Score      Pain Loc      Pain Edu?      Excl. in GC?    No data found.  Updated Vital Signs BP 116/80 (BP Location: Left Arm)   Pulse (!) 104   Temp 97.9 F (36.6 C)   Resp 18   LMP 02/27/2021   SpO2 97%   Visual Acuity Right Eye Distance:   Left Eye Distance:   Bilateral Distance:    Right Eye Near:   Left Eye Near:    Bilateral Near:     Physical Exam Vitals and nursing note reviewed.  Constitutional:      General: She is not in acute distress.    Appearance: She is well-developed. She is obese. She is not ill-appearing.  HENT:     Head: Normocephalic and atraumatic.     Mouth/Throat:     Mouth: Mucous membranes are moist.  Eyes:     Conjunctiva/sclera: Conjunctivae normal.   Cardiovascular:     Rate and Rhythm: Normal rate and regular rhythm.     Heart sounds: Normal heart sounds.  Pulmonary:     Effort: Pulmonary effort is normal. No respiratory distress.     Breath sounds: Normal breath sounds.  Abdominal:     Palpations: Abdomen is soft.     Tenderness: There is no abdominal tenderness. There is no right CVA tenderness, left CVA tenderness, guarding or rebound.  Musculoskeletal:     Cervical back: Neck supple.  Skin:    General: Skin is warm and dry.  Neurological:     General: No focal deficit present.     Mental Status: She is alert and oriented to person, place, and time.     Gait: Gait normal.  Psychiatric:        Mood and Affect: Mood normal.        Behavior: Behavior normal.     UC Treatments / Results  Labs (all labs ordered are listed, but only abnormal results are displayed) Labs Reviewed  CERVICOVAGINAL ANCILLARY ONLY    EKG   Radiology No results found.  Procedures Procedures (including critical care time)  Medications Ordered in UC Medications - No data to display  Initial Impression / Assessment and Plan / UC Course  I have reviewed the triage vital signs and the nursing notes.  Pertinent labs & imaging results that were available during my care of the patient were reviewed by me and considered in my medical decision making (see chart for details).   Vaginal discharge, STD screening.  Patient obtained vaginal self swab for testing.  Treating with metronidazole.  Discussed with patient that she may require additional treatment if her test results are positive.  Discussed that her sexual partner may also require treatment.  Instructed her to abstain from sexual activity for at least 7 days.  Instructed her to follow-up with her PCP or OB/GYN if her symptoms are not improving.  Patient agrees to plan of care.    Final Clinical Impressions(s) / UC Diagnoses   Final diagnoses:  Vaginal discharge  Screening for STD  (sexually transmitted disease)     Discharge Instructions      Take metronidazole twice a day for 7 days.    Your vaginal tests are pending.  If your test results are positive, we will call you.  Do not have sexual  activity for at least 7 days.    Follow up with your primary care provider if your symptoms are not improving.        ED Prescriptions     Medication Sig Dispense Auth. Provider   metroNIDAZOLE (FLAGYL) 500 MG tablet Take 1 tablet (500 mg total) by mouth 2 (two) times daily. 14 tablet Mickie Bail, NP      PDMP not reviewed this encounter.   Mickie Bail, NP 03/27/21 [phone]

## 2021-03-30 LAB — CERVICOVAGINAL ANCILLARY ONLY
Bacterial Vaginitis (gardnerella): NEGATIVE
Candida Glabrata: NEGATIVE
Candida Vaginitis: NEGATIVE
Chlamydia: NEGATIVE
Comment: NEGATIVE
Comment: NEGATIVE
Comment: NEGATIVE
Comment: NEGATIVE
Comment: NEGATIVE
Comment: NORMAL
Neisseria Gonorrhea: NEGATIVE
Trichomonas: NEGATIVE

## 2021-04-27 ENCOUNTER — Ambulatory Visit (LOCAL_COMMUNITY_HEALTH_CENTER): Payer: Medicaid Other

## 2021-04-27 ENCOUNTER — Other Ambulatory Visit: Payer: Self-pay

## 2021-04-27 DIAGNOSIS — Z111 Encounter for screening for respiratory tuberculosis: Secondary | ICD-10-CM

## 2021-04-27 NOTE — Progress Notes (Signed)
Patient reports no drug allergies at all. Allergy not removed off her record. Hart Carwin, RN

## 2021-04-28 ENCOUNTER — Emergency Department
Admission: EM | Admit: 2021-04-28 | Discharge: 2021-04-28 | Disposition: A | Discharge status: Home / Self Care | Payer: Medicaid Other | Attending: Emergency Medicine | Admitting: Emergency Medicine

## 2021-04-28 ENCOUNTER — Emergency Department: Payer: Medicaid Other

## 2021-04-28 ENCOUNTER — Encounter: Payer: Self-pay | Admitting: Emergency Medicine

## 2021-04-28 ENCOUNTER — Other Ambulatory Visit: Payer: Self-pay

## 2021-04-28 DIAGNOSIS — I10 Essential (primary) hypertension: Secondary | ICD-10-CM | POA: Diagnosis not present

## 2021-04-28 DIAGNOSIS — Z91010 Allergy to peanuts: Secondary | ICD-10-CM | POA: Insufficient documentation

## 2021-04-28 DIAGNOSIS — R102 Pelvic and perineal pain: Secondary | ICD-10-CM | POA: Insufficient documentation

## 2021-04-28 DIAGNOSIS — O26899 Other specified pregnancy related conditions, unspecified trimester: Secondary | ICD-10-CM | POA: Insufficient documentation

## 2021-04-28 LAB — COMPREHENSIVE METABOLIC PANEL
ALT: 21 U/L (ref 0–44)
AST: 20 U/L (ref 15–41)
Albumin: 3.9 g/dL (ref 3.5–5.0)
Alkaline Phosphatase: 72 U/L (ref 38–126)
Anion gap: 7 (ref 5–15)
BUN: 15 mg/dL (ref 6–20)
CO2: 24 mmol/L (ref 22–32)
Calcium: 9.2 mg/dL (ref 8.9–10.3)
Chloride: 102 mmol/L (ref 98–111)
Creatinine, Ser: 0.89 mg/dL (ref 0.44–1.00)
GFR, Estimated: >60 mL/min (ref >60)
Glucose, Bld: 96 mg/dL (ref 70–99)
Potassium: 4.1 mmol/L (ref 3.5–5.1)
Sodium: 133 mmol/L — ABNORMAL LOW (ref 135–145)
Total Bilirubin: 0.5 mg/dL (ref 0.3–1.2)
Total Protein: 8.8 g/dL — ABNORMAL HIGH (ref 6.5–8.1)

## 2021-04-28 LAB — CBC
HCT: 40 % (ref 36.0–46.0)
Hemoglobin: 12.2 g/dL (ref 12.0–15.0)
MCH: 22 pg — ABNORMAL LOW (ref 26.0–34.0)
MCHC: 30.5 g/dL (ref 30.0–36.0)
MCV: 72.2 fL — ABNORMAL LOW (ref 80.0–100.0)
Platelets: 288 10*3/uL (ref 150–400)
RBC: 5.54 MIL/uL — ABNORMAL HIGH (ref 3.87–5.11)
RDW: 15.7 % — ABNORMAL HIGH (ref 11.5–15.5)
WBC: 5.9 10*3/uL (ref 4.0–10.5)
nRBC: 0 % (ref 0.0–0.2)

## 2021-04-28 LAB — URINALYSIS, ROUTINE W REFLEX MICROSCOPIC
Bilirubin Urine: NEGATIVE
Glucose, UA: NEGATIVE mg/dL
Ketones, ur: NEGATIVE mg/dL
Leukocytes,Ua: NEGATIVE
Nitrite: NEGATIVE
Protein, ur: NEGATIVE mg/dL
Specific Gravity, Urine: 1.016 (ref 1.005–1.030)
pH: 7 (ref 5.0–8.0)

## 2021-04-28 LAB — ABO/RH: ABO/RH(D): A POS

## 2021-04-28 LAB — LIPASE, BLOOD: Lipase: 35 U/L (ref 11–51)

## 2021-04-28 LAB — POC URINE PREG, ED: Preg Test, Ur: POSITIVE — AB

## 2021-04-28 LAB — HCG, QUANTITATIVE, PREGNANCY: hCG, Beta Chain, Quant, S: 424 m[IU]/mL — ABNORMAL HIGH (ref <5)

## 2021-04-28 MED ORDER — PRENATAL 19 PO TABS: 1.0000 | ORAL_TABLET | Freq: Every day | ORAL | 0 refills | Status: AC

## 2021-04-28 NOTE — ED Provider Notes (Signed)
Emergency Medicine Provider Triage Evaluation Note  Denise [name] , a 20 y.o. female  was evaluated in triage.  Pt complains of lower abdominal pain and cramping.  She states that she has had some light vaginal bleeding today.  Patient states that she is approximately 5 to [redacted] weeks pregnant.  She denies chest pain, chest tightness or shortness of breath.  No dysuria, nausea or vomiting.  She has been afebrile at home.  Review of Systems  Positive: Patient has vaginal bleeding and lower abdominal pain.  Negative: No chest pain, chest tightness, nausea or vomiting.   Physical Exam  BP (!) 146/78   Pulse (!) 105   Temp 98.1 F (36.7 C) (Oral)   Resp 20   Ht 5\' 2"  (1.575 m)   Wt 116.6 kg   LMP 02/27/2021 (Approximate) Comment: Patient awaiting her first prenatal appt.  SpO2 100%   BMI 47.01 kg/m  Gen:   Awake, no distress   Resp:  Normal effort  MSK:   Moves extremities without difficulty   Medical Decision Making  Medically screening exam initiated at 4:54 PM.  Appropriate orders placed.  Denise [name] was informed that the remainder of the evaluation will be completed by another provider, this initial triage assessment does not replace that evaluation, and the importance of remaining in the ED until their evaluation is complete.     Denise [name] [place], PA-C 04/28/21 1655    13/08/22, MD 04/28/21 1728

## 2021-04-28 NOTE — ED Triage Notes (Signed)
Pt via POV from home. Pt c/o lower abd pain, pt states it is a cramping pain. Pt states she is pregnant but unknown how far along she is. Pt thinks her last period was 10/09 but she is not sure. Denies NVD. Denies vaginal bleeding. Denies urinary symptoms. Pt is A&OX4 and NAD.

## 2021-04-28 NOTE — Discharge Instructions (Addendum)
Your ultrasound did not show a fetus yet in the uterus which is likely because this is very early on in your pregnancy.  There was a cystic structure in your left ovary which is probably benign but there is a small chance that you could have an ectopic pregnancy.  You should follow-up to have a repeat pregnancy hormone drawn in 2 days to make sure that it is appropriately rising.  Please return to the emergency department if you develop worsening pain, lightheadedness, dizziness or vaginal bleeding.

## 2021-04-28 NOTE — ED Notes (Signed)
E-signature pad unavailable - Pt verbalized understanding of D/C information - no additional concerns at this time.  

## 2021-04-29 ENCOUNTER — Telehealth: Payer: Self-pay

## 2021-04-29 NOTE — Telephone Encounter (Signed)
Attempted to reach patient via phone. Phone on file not accepting calls. Send message thru [place]. Pending patient review

## 2021-04-29 NOTE — Telephone Encounter (Signed)
Patient is aware via my chart message.

## 2021-04-29 NOTE — ED Provider Notes (Signed)
[facility]  ____________________________________________   Event Date/Time   First MD Initiated Contact with Patient 04/28/21 1912     (approximate)  I have reviewed the triage vital signs and the nursing notes.   HISTORY  Chief Complaint Abdominal Pain    HPI Denise [name] is a 20 y.o. female G1P0 with no pmh who presents with abdominal pain. She had a positive pregnancy 3 days ago. Pain is located around the umbilicus, cramping in nature. Pain is intermittent. No associated N/V, diarrhea or constipation. No fevers, chills or urinary symptoms. No vaginal bleeding. LMP was 1 month ago, Oct 9th. This is her first time being pregnant. Does not yet have an OB.          Past Medical History:  Diagnosis Date   Premature baby    born at 5 weeks   Premature puberty    mother started menses at 28yrs.    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 No pertinent surgical history.  Prior to Admission medications   Medication Sig Start Date End Date Taking? Authorizing Provider  Prenatal Vit-DSS-Fe Fum-FA (PRENATAL 19) tablet Take 1 tablet by mouth daily. 04/28/21  Yes Georga Hacking, MD  benzonatate (TESSALON) 100 MG capsule Take 1 capsule (100 mg total) by mouth 3 (three) times daily as needed for cough. Patient not taking: Reported on 04/27/2021 10/23/20   Mickie Bail, NP  guaiFENesin (MUCINEX) 600 MG 12 hr tablet Take by mouth 2 (two) times daily. Patient not taking: Reported on 04/27/2021    [provider]  metroNIDAZOLE (FLAGYL) 500 MG tablet Take 1 tablet (500 mg total) by mouth 2 (two) times  daily. Patient not taking: Reported on 04/27/2021 03/27/21   Mickie Bail, NP    Allergies Peanut-containing drug products  Family History  Problem Relation Age of Onset   Bipolar disorder Mother    Enuresis Sister        nocturnal    Social History Social History   Tobacco Use   Smoking status: Never   Smokeless tobacco: Never  Vaping Use   Vaping Use: Never used  Substance Use Topics   Alcohol use: Yes    Alcohol/week: 2.0 standard drinks    Types: 2 Shots of liquor per week    Comment: last use 02/23/21 q 3 mo   Drug use: No    Review of Systems   Review of Systems  Constitutional:  Negative for chills and fever.  Gastrointestinal:  Positive for abdominal pain. Negative for nausea and vomiting.  Genitourinary:  Negative for dysuria, vaginal bleeding and vaginal discharge.  All other systems reviewed and are negative.  Physical Exam Updated Vital Signs BP 136/80   Pulse 99   Temp 98 F (36.7 C) (Oral)   Resp 18   Ht 5\' 2"  (1.575 m)   Wt 116.6 kg   LMP 02/27/2021 (Approximate) Comment: Patient awaiting her first prenatal appt.  SpO2 98%   BMI 47.01 kg/m   Physical Exam Vitals and nursing note reviewed.  Constitutional:      General: She is not in acute distress.    Appearance: Normal appearance.  HENT:  Head: Normocephalic and atraumatic.  Eyes:     General: No scleral icterus.    Conjunctiva/sclera: Conjunctivae normal.  Pulmonary:     Effort: Pulmonary effort is normal. No respiratory distress.     Breath sounds: No stridor.  Abdominal:     General: Abdomen is flat. There is no distension.     Palpations: Abdomen is soft.     Comments: Mild tenderness to palpation in the periumbical and suprapubic regions; no focal ttp in the RLQ of LLQ  Musculoskeletal:        General: No deformity or signs of injury.     Cervical back: Normal range of motion.  Skin:    General: Skin is dry.     Coloration: Skin is not jaundiced or pale.  Neurological:      General: No focal deficit present.     Mental Status: She is alert and oriented to person, place, and time. Mental status is at baseline.  Psychiatric:        Mood and Affect: Mood normal.        Behavior: Behavior normal.     LABS (all labs ordered are listed, but only abnormal results are displayed)  Labs Reviewed  COMPREHENSIVE METABOLIC PANEL - Abnormal; Notable for the following components:      Result Value   Sodium 133 (*)    Total Protein 8.8 (*)    All other components within normal limits  CBC - Abnormal; Notable for the following components:   RBC 5.54 (*)    MCV 72.2 (*)    MCH 22.0 (*)    RDW 15.7 (*)    All other components within normal limits  URINALYSIS, ROUTINE W REFLEX MICROSCOPIC - Abnormal; Notable for the following components:   Color, Urine YELLOW (*)    APPearance HAZY (*)    Hgb urine dipstick SMALL (*)    Bacteria, UA RARE (*)    All other components within normal limits  HCG, QUANTITATIVE, PREGNANCY - Abnormal; Notable for the following components:   hCG, Beta Chain, Quant, S 424 (*)    All other components within normal limits  POC URINE PREG, ED - Abnormal; Notable for the following components:   Preg Test, Ur POSITIVE (*)    All other components within normal limits  LIPASE, BLOOD  ABO/RH   ____________________________________________  EKG  N/a ____________________________________________  RADIOLOGY Ky Barban, personally viewed and evaluated these images (plain radiographs) as part of my medical decision making, as well as reviewing the written report by the radiologist.  ED MD interpretation:  I reviewed the OB US which does not show any IUP, but does show a hypoechoic structure in the L ovary, favored to represent and corpus luteal hemorrhage     ____________________________________________   PROCEDURES  Procedure(s) performed (including Critical  Care):  Procedures   ____________________________________________   INITIAL IMPRESSION / ASSESSMENT AND PLAN / ED COURSE     20 yo G1P0 presents with abdominal cramping in the setting of a positive pregnancy test. VS are wnl and she is very well appearing Abdominal exam is benign, no guarding, and tenderness is poorly localized. She has no other associated symptoms including fever, dysuria or vaginal bleeding. HCG is 424. Her [place] does not show an IUP, but there is a hypoechoic structure in the L ovary. Per rads this is most likely a corpus luteal hemorrhage however can't rule ectopic. Pt is non-tender in the LLQ. Discussed with Dr. Curtis Sites, who  reviewed the [place] and agrees that it looks more to be something inside the ovary vs. Adjacent. She recommended that as long as the pts abdominal exam is benign and she is HD stable that she have a repeat BHCG in 48 hrs and f/u. Pt does not show any signs of HD instability is very well appearing, and has a benign exam. I am comfortable with dc with close follow-up. I discussed the results with pt and her mother who is at bedside, including the fact that the pregnancy is of undetermined location and that we have not ruled out ectopic and they understood the need for close f/u. Strict return precautions reviewed as well. Dr. Bjorn Pippin planning to send message to office to arrange f/u.       ____________________________________________   FINAL CLINICAL IMPRESSION(S) / ED DIAGNOSES  Final diagnoses:  Pelvic pain     ED Discharge Orders          Ordered    Prenatal Vit-DSS-Fe Fum-FA (PRENATAL 19) tablet  Daily        04/28/21 1954             Note:  This document was prepared using Dragon voice recognition software and may include unintentional dictation errors.    Georga Hacking, MD 04/29/21 1050

## 2021-04-29 NOTE — Telephone Encounter (Signed)
-----   Message from Natale Milch, MD sent at 04/28/2021  7:49 PM EST ----- [place] Dec can you schedule this patient for an ER follow up because of pregnancy of unknown location with one of the MDs for Thursday 04/30/2021- Will need labs so appointment needs to be before 4pm. Thanks- Dr. Jerene Pitch

## 2021-04-30 ENCOUNTER — Ambulatory Visit (INDEPENDENT_AMBULATORY_CARE_PROVIDER_SITE_OTHER): Payer: Medicaid Other | Admitting: Obstetrics and Gynecology

## 2021-04-30 ENCOUNTER — Other Ambulatory Visit: Payer: Self-pay

## 2021-04-30 ENCOUNTER — Other Ambulatory Visit: Payer: Medicaid Other

## 2021-04-30 ENCOUNTER — Encounter: Payer: Self-pay | Admitting: Obstetrics and Gynecology

## 2021-04-30 VITALS — BP 128/72 | Ht 62.0 in | Wt 257.0 lb

## 2021-04-30 DIAGNOSIS — O3680X0 Pregnancy with inconclusive fetal viability, not applicable or unspecified: Secondary | ICD-10-CM

## 2021-04-30 NOTE — Patient Instructions (Signed)
First Trimester of Pregnancy The first trimester of pregnancy starts on the first day of your last menstrual period until the end of week 12. This is months 1 through 3 of pregnancy. A week after a sperm fertilizes an egg, the egg will implant into the wall of the uterus and begin to develop into a baby. By the end of 12 weeks, all the baby's organs will be formed and the baby will be 2-3 inches in size. Body changes during your first trimester Your body goes through many changes during pregnancy. The changes vary and generally return to normal after your baby is born. Physical changes You may gain or lose weight. Your breasts may begin to grow larger and become tender. The tissue that surrounds your nipples (areola) may become darker. Dark spots or blotches (chloasma or mask of pregnancy) may develop on your face. You may have changes in your hair. These can include thickening or thinning of your hair or changes in texture. Health changes You may feel nauseous, and you may vomit. You may have heartburn. You may develop headaches. You may develop constipation. Your gums may bleed and may be sensitive to brushing and flossing. Other changes You may tire easily. You may urinate more often. Your menstrual periods will stop. You may have a loss of appetite. You may develop cravings for certain kinds of food. You may have changes in your emotions from day to day. You may have more vivid and strange dreams. Follow these instructions at home: Medicines Follow your health care provider's instructions regarding medicine use. Specific medicines may be either safe or unsafe to take during pregnancy. Do not take any medicines unless told to by your health care provider. Take a prenatal vitamin that contains at least 600 micrograms (mcg) of folic acid. Eating and drinking Eat a healthy diet that includes fresh fruits and vegetables, whole grains, good sources of protein such as meat, eggs, or tofu,  and low-fat dairy products. Avoid raw meat and unpasteurized juice, milk, and cheese. These carry germs that can harm you and your baby. If you feel nauseous or you vomit: Eat 4 or 5 small meals a day instead of 3 large meals. Try eating a few soda crackers. Drink liquids between meals instead of during meals. You may need to take these actions to prevent or treat constipation: Drink enough fluid to keep your urine pale yellow. Eat foods that are high in fiber, such as beans, whole grains, and fresh fruits and vegetables. Limit foods that are high in fat and processed sugars, such as fried or sweet foods. Activity Exercise only as directed by your health care provider. Most people can continue their usual exercise routine during pregnancy. Try to exercise for 30 minutes at least 5 days a week. Stop exercising if you develop pain or cramping in the lower abdomen or lower back. Avoid exercising if it is very hot or humid or if you are at high altitude. Avoid heavy lifting. If you choose to, you may have sex unless your health care provider tells you not to. Relieving pain and discomfort Wear a good support bra to relieve breast tenderness. Rest with your legs elevated if you have leg cramps or low back pain. If you develop bulging veins (varicose veins) in your legs: Wear support hose as told by your health care provider. Elevate your feet for 15 minutes, 3-4 times a day. Limit salt in your diet. Safety Wear your seat belt at all times when driving  or riding in a car. Talk with your health care provider if someone is verbally or physically abusive to you. Talk with your health care provider if you are feeling sad or have thoughts of hurting yourself. Lifestyle Do not use hot tubs, steam rooms, or saunas. Do not douche. Do not use tampons or scented sanitary pads. Do not use herbal remedies, alcohol, illegal drugs, or medicines that are not approved by your health care provider. Chemicals  in these products can harm your baby. Do not use any products that contain nicotine or tobacco, such as cigarettes, e-cigarettes, and chewing tobacco. If you need help quitting, ask your health care provider. Avoid cat litter boxes and soil used by cats. These carry germs that can cause birth defects in the baby and possibly loss of the unborn baby (fetus) by miscarriage or stillbirth. General instructions During routine prenatal visits in the first trimester, your health care provider will do a physical exam, perform necessary tests, and ask you how things are going. Keep all follow-up visits. This is important. Ask for help if you have counseling or nutritional needs during pregnancy. Your health care provider can offer advice or refer you to specialists for help with various needs. Schedule a dentist appointment. At home, brush your teeth with a soft toothbrush. Floss gently. Write down your questions. Take them to your prenatal visits. Where to find more information American Pregnancy Association: americanpregnancy.Azure and Gynecologists: PoolDevices.com.pt Office on [employer]: KeywordPortfolios.com.br Contact a health care provider if you have: Dizziness. A fever. Mild pelvic cramps, pelvic pressure, or nagging pain in the abdominal area. Nausea, vomiting, or diarrhea that lasts for 24 hours or longer. A bad-smelling vaginal discharge. Pain when you urinate. Known exposure to a contagious illness, such as chickenpox, measles, Zika virus, HIV, or hepatitis. Get help right away if you have: Spotting or bleeding from your vagina. Severe abdominal cramping or pain. Shortness of breath or chest pain. Any kind of trauma, such as from a fall or a car crash. New or increased pain, swelling, or redness in an arm or leg. Summary The first trimester of pregnancy starts on the first day of your last menstrual period until the end of week  12 (months 1 through 3). Eating 4 or 5 small meals a day rather than 3 large meals may help to relieve nausea and vomiting. Do not use any products that contain nicotine or tobacco, such as cigarettes, e-cigarettes, and chewing tobacco. If you need help quitting, ask your health care provider. Keep all follow-up visits. This is important. This information is not intended to replace advice given to you by your health care provider. Make sure you discuss any questions you have with your health care provider. Document Revised: 11/14/2019 Document Reviewed: 09/20/2019 Elsevier Patient Education  2022 [place]  SAFE IN PREGNANCY   COMPLAINT                                        MEDICINE                                                 Constipation Add fiber supplements such as Metamucil, Fibercon, or Citrucel.  Increasing fiber intake via  bran cereals, oatmeal, leafy greens, and prunes is another alternative.  Increase you fluid intake.  You may also add Colace 185m by mouth twice daily or Senakot   Cough/Cold      Robitusin, Mucinex  Cuts and  Scrapes Bacitracin, Neosporin, Plysporin  Dental Pain     Tylenol. Avoid aspirin  products, which  could cause bleeding problems for mother and baby.  Also avoid ibuprofen products, such as Advil, Motrin or Aleve unless your doctor or nurse specifically recommends these drugs.  Diarrhea Immodium, Kaopectate, or Donnagel PG .  Ensure adequate hydration by taking in plenty of clear liquids such as Gatorade, ginger ale, and jello.  Call if symptoms persist over 24-hrs.  Do NOT take Pepto-Bismol  Fever Take Tylenol *, Extra-Strength Tylenol *, or any aspirin-free pain reliever (acetaminophen).  Avoid aspirin products, which could cause bleeding problems for mother and baby.  Also avoid ibuprofen products, such as Advil, Motrin or Aleve unless your doctor or nurse specifically recommends these  drugs.  If you temperature is over 101o F (38.3o C), call the  clinic  Gas    Gaviscon,, Mylicon,  Riopan    Headache   Tylenol. Avoid aspirin                                                                         products, which  could cause bleeding problems for mother and baby.  Also avoid ibuprofen products, such as Advil, Motrin or Aleve unless your doctor or nurse specifically recommends these drugs.  Head Lice     Nix  Heartburn/Indigestion TUMS, Rolaids, Zantac, Mylanta. Maalox   Avoid fatty, fried, or spicy foods.  Eat small frequent meals 5-6 times daily as opposed to 3 large meals a day.  Hemorrhoids Annusol HC, Tucks , Perperation H , or Dermaplast.  Warm sitz baths for 30 minutes twice daily.  Air dry and sleep without underwear.  Avoid constipation and see recommendation above for constipation as this may exacerbate/worsen you hemorrhoids  Leg Cramps     TUMS, Vitamin with Potassium  Leg Swelling  Elevate legs above                                                                                            waist, lay on your left side.  Well fitting  shoes and support  hose  Muscle Aches                Tylenol. Avoid aspirin products, which  could cause bleeding problems for mother and baby.  Also avoid ibuprofen products, such as Advil, Motrin or Aleve unless your doctor or nurse specifically recommends these drugs.  Nausea/Vomiting Emetrol.  Supportive measures to ensure you stay adequately hydrated.  Sips of juice, Gatorade, ginger ale 4 times an hour.  If you are unable to tolerate fluid intake for greater  than 8hrs call the clinic.  You may want to allow ginger ale to go flat as carbonation my precipitate emesis.  Over the counter chewable prenatal vitamins are available.  Vitamin B6 (pyridoxine) 10 to 24mg  every 8hrs and doxylamine (Unisome sleep tabs) 25mg  at bedtime and 12.5mg  in the morning is first line.  Note that B6 supplements are  not FDA regulated, there is a prescription of the above combination commercially available called Bonjesta.  Nosebleeds Apply ice pack to nose.  Particularly in winter time with dryer air, consider the use of a humidifier   Painful urination    Call clinic  Rash/ Itch Benadryl, Aveeno, Cortaid.  For severe whole body itching in the late second early third trimester call the office  Sleep Problems    Benadryl, Tylenol  PM, or Unisom  Seasonal Allergies    Claritin, Zyrtec,          Benadryl, Mucinex  Sinus Congestion Tylenol with Sudafed (pseudoephedrine no phenylephrine) or Actifed.  Sore Throat Chloroseptic lozenges and sprays such as Cepacol.  Salt water gargles (1 teaspoon of table salt in one quart of water)  Call if associated temperature above 101 Fahrenheit or 38.3 Celsius  Yeast Infection     Monistat   Ectopic Pregnancy An ectopic pregnancy happens when a fertilized egg attaches (implants) outside the uterus. In a normal pregnancy, a fertilized egg implants in the uterus. An ectopic pregnancy cannot develop into a healthy baby. Most ectopic pregnancies occur in one of the fallopian tubes, which is where an egg travels from an ovary to get to the uterus. This is called a tubal pregnancy. An ectopic pregnancy can also happen on an ovary, on the cervix, or in the abdomen. When a fertilized egg implants on tissue outside the uterus and begins to grow, it may cause the tissue to tear or burst. This is known as a ruptured ectopic pregnancy. The tear or burst causes internal bleeding. This may cause intense pain in the abdomen. An ectopic pregnancy is a medical emergency and can be life-threatening. What are the causes? The most common cause of this condition is damage to one of the fallopian tubes. A fallopian tube may be narrowed or blocked, and that stops the fertilized egg from reaching the uterus. Sometimes, the cause of this condition is not known. What increases the  risk? The following factors may make you more likely to develop this condition: Having gone through infertility treatment before. Having had an ectopic pregnancy before. Having had surgery to have the fallopian tubes tied. Becoming pregnant while using an intrauterine device for birth control. Taking birth control pills before the age of 30. Other risk factors include: Smoking. Alcohol use. History of DES exposure. DES is a medicine that was used until 1971 and affected babies whose mothers took the medicine. What are the signs or symptoms? Common symptoms of this condition include: Missing a menstrual period. Nausea or tiredness. Tender breasts. Other normal pregnancy symptoms. Other symptoms may include: Pain during sex. Vaginal bleeding or spotting. Cramping or pain in the lower abdomen. A fast heartbeat, low blood pressure, and sweating. Pain or increased pressure while having a bowel movement. Symptoms of a ruptured ectopic pregnancy and internal bleeding may include: Sudden, severe pain in the abdomen. Dizziness, weakness, feeling light-headed, or fainting. Pain in the shoulder or neck area. How is this diagnosed? This condition is diagnosed by: A blood test to check for the pregnancy hormone. A pelvic exam to find painful areas  or a mass in the abdomen. Ultrasound. A probe is inserted into the vagina to see if there is a pregnancy in or outside the uterus. Taking a sample of tissue from the uterus. Surgery to look closely at the fallopian tubes through an incision in the abdomen. How is this treated? This condition is usually treated with medicine or surgery. Sometimes, ectopic pregnancies can resolve on their own, under close monitoring by your health care provider. Medicine A medicine called methotrexate may be given to cause the pregnancy tissue to be absorbed. The medicine may be given if: The diagnosis is made early, with no signs of active bleeding. The fallopian  tube has not torn or burst. You will need blood tests to make sure the medicine is working. It may take 4-6 weeks for the pregnancy tissues to be absorbed. Surgery Surgery may be performed to: Remove the pregnancy tissue. Stop internal bleeding. Remove part or all of the fallopian tube. Remove the uterus. This is rare. After surgery, you may need to have blood tests to make sure the surgery worked. Follow these instructions at home: Medicines Take over-the-counter and prescription medicines only as told by your health care provider. Ask your health care provider if the medicine prescribed to you: Requires you to avoid driving or using machinery. Can cause constipation. You may need to take these actions to prevent or treat constipation: Drink enough fluid to keep your urine pale yellow. Take over-the-counter or prescription medicines. Eat foods that are high in fiber, such as beans, whole grains, and fresh fruits and vegetables. Limit foods that are high in fat and processed sugars, such as fried or sweet foods. General instructions Rest or limit your activity, if told by your health care provider. Do not have sex or put anything in your vagina, such as tampons or douches, for 6 weeks or until your health care provider says it is safe. Do not lift anything that is heavier than 10 lb (4.5 kg), or the limit that you are told, until your health care provider says that it is safe. Return to your normal activities as told by your health care provider. Ask your health care provider what activities are safe for you. Keep all follow-up visits. This is important. Contact a health care provider if: You have a fever or chills. You have nausea and vomiting. Get help right away if: Your pain gets worse or is not relieved by medicine. You feel dizzy or weak. You feel light-headed or you faint. You have sudden, severe pain in your abdomen. You have sudden pain in the shoulder or neck  area. Summary An ectopic pregnancy happens when a fertilized egg implants outside the uterus. Most ectopic pregnancies occur in one of the fallopian tubes. An ectopic pregnancy is a medical emergency and can be life-threatening. The most common cause of this condition is damage to one of the fallopian tubes. This condition is usually treated with medicine or surgery. Some ectopic pregnancies resolve on their own, under close monitoring by your health care provider. This information is not intended to replace advice given to you by your health care provider. Make sure you discuss any questions you have with your health care provider. Document Revised: 09/18/2019 Document Reviewed: 09/18/2019 Elsevier Patient Education  2022 [employer].

## 2021-04-30 NOTE — Progress Notes (Signed)
Patient ID: Denise [name], female   DOB: Apr 22, 2001, 20 y.o.   MRN: [id number]  Reason for Consult: Gynecologic Exam   Referred by No ref. provider found  Subjective:     HPI:  Denise [name] is a 20 y.o. female she presents today for follow-up of pregnancy of unknown location.  She was seen previously in the emergency room and had a pelvic ultrasound and beta-hCG performed.  Pelvic ultrasound did not show the location of her pregnancy.  Her beta hCG value was 420.  She reports that her pelvic and abdominal pain has resolved.  She denies any vaginal bleeding or spotting.  She denies any nausea.  She reports that the first day of her last menstrual period was 03/29/2021.  She reports that she found out that she was pregnant when she took a pregnancy test with her friend.  She did not expect to be pregnant.  She has been having unprotected intercourse.  Gynecological History  Patient's last menstrual period was 19/02/2021 (approximate).  Past Medical History:  Diagnosis Date   Premature baby    born at 46 weeks   Premature puberty    mother started menses at 29yrs.   Family History  Problem Relation Age of Onset   Bipolar disorder Mother    Enuresis Sister        nocturnal   No past surgical history on file.  Short Social History:  Social History   Tobacco Use   Smoking status: Never   Smokeless tobacco: Never  Substance Use Topics   Alcohol use: Yes    Alcohol/week: 2.0 standard drinks    Types: 2 Shots of liquor per week    Comment: last use 02/23/21 q 3 mo    Allergies  Allergen Reactions   Peanut-Containing Drug Products Swelling    Documented in [facility] record    Current Outpatient Medications  Medication Sig Dispense Refill   benzonatate (TESSALON) 100 MG capsule Take 1 capsule (100 mg total) by mouth 3 (three) times daily as needed for cough. (Patient not taking: Reported on 04/27/2021) 21 capsule 0   guaiFENesin  (MUCINEX) 600 MG 12 hr tablet Take by mouth 2 (two) times daily. (Patient not taking: Reported on 04/27/2021)     metroNIDAZOLE (FLAGYL) 500 MG tablet Take 1 tablet (500 mg total) by mouth 2 (two) times daily. (Patient not taking: Reported on 04/27/2021) 14 tablet 0   Prenatal Vit-DSS-Fe Fum-FA (PRENATAL 19) tablet Take 1 tablet by mouth daily. (Patient not taking: Reported on 04/30/2021) 30 tablet 0   No current facility-administered medications for this visit.    Review of Systems  Constitutional: Negative for chills, fatigue, fever and unexpected weight change.  HENT: Negative for trouble swallowing.  Eyes: Negative for loss of vision.  Respiratory: Negative for cough, shortness of breath and wheezing.  Cardiovascular: Negative for chest pain, leg swelling, palpitations and syncope.  GI: Negative for abdominal pain, blood in stool, diarrhea, nausea and vomiting.  GU: Negative for difficulty urinating, dysuria, frequency and hematuria.  Musculoskeletal: Negative for back pain, leg pain and joint pain.  Skin: Negative for rash.  Neurological: Negative for dizziness, headaches, light-headedness, numbness and seizures.  Psychiatric: Negative for behavioral problem, confusion, depressed mood and sleep disturbance.       Objective:  Objective   Vitals:   04/30/21 1541  BP: 128/72  Weight: 257 lb (116.6 kg)  Height: 5\' 2"  (1.575 m)   Body mass index is  47.01 kg/m.  Physical Exam Vitals and nursing note reviewed. Exam conducted with a chaperone present.  Constitutional:      Appearance: Normal appearance.  HENT:     Head: Normocephalic and atraumatic.  Eyes:     Extraocular Movements: Extraocular movements intact.     Pupils: Pupils are equal, round, and reactive to light.  Cardiovascular:     Rate and Rhythm: Normal rate and regular rhythm.  Pulmonary:     Effort: Pulmonary effort is normal.     Breath sounds: Normal breath sounds.  Abdominal:     General: Abdomen is flat.  There is no distension.     Palpations: Abdomen is soft.     Tenderness: There is no abdominal tenderness.  Musculoskeletal:     Cervical back: Normal range of motion.  Skin:    General: Skin is warm and dry.  Neurological:     General: No focal deficit present.     Mental Status: She is alert and oriented to person, place, and time.  Psychiatric:        Behavior: Behavior normal.        Thought Content: Thought content normal.        Judgment: Judgment normal.    Assessment/Plan:     20 year old G1, P0 at  [redacted] weeks gestational age by LMP with pregnancy of unknown location. Beta-hCG repeated today.  Discussed that we we will trend the rise in her beta hCGs until the location of her pregnancy can be determined.  We will plan for repeat labs next week on Monday Wednesday and Friday assuming that she is having normal increases in the values.  Follow-up appointment with a MD on Friday.  We will plan a pelvic ultrasound for approximately 2 weeks from now to verify location of pregnancy.  Discussed ectopic pregnancy precautions including pelvic rest, reasons to return to the hospital, not traveling distances away from the hospital, and trying not to be alone.  Discussed treatment options for an ectopic pregnancy such as salpingectomy versus methotrexate therapy should she be found to have an ectopic pregnancy.  Encouraged the patient to initiate a prenatal vitamin.  Patient was given general information regarding food safety and safe over the counter medications in pregnancy.  More than 30 minutes were spent face to face with the patient in the room, reviewing the medical record, labs and images, and coordinating care for the patient. The plan of management was discussed in detail and counseling was provided.    Adrian Prows MD [facility], [facility] 04/30/2021 4:41 PM

## 2021-05-01 LAB — BETA HCG QUANT (REF LAB): hCG Quant: 756 m[IU]/mL

## 2021-05-04 ENCOUNTER — Other Ambulatory Visit: Payer: Self-pay

## 2021-05-04 ENCOUNTER — Other Ambulatory Visit: Payer: Self-pay | Admitting: Obstetrics and Gynecology

## 2021-05-04 ENCOUNTER — Other Ambulatory Visit: Payer: Medicaid Other

## 2021-05-04 DIAGNOSIS — O3680X0 Pregnancy with inconclusive fetal viability, not applicable or unspecified: Secondary | ICD-10-CM

## 2021-05-05 ENCOUNTER — Ambulatory Visit: Payer: Medicaid Other

## 2021-05-05 LAB — BETA HCG QUANT (REF LAB): hCG Quant: 3076 m[IU]/mL

## 2021-05-06 ENCOUNTER — Other Ambulatory Visit: Payer: Medicaid Other

## 2021-05-06 ENCOUNTER — Other Ambulatory Visit: Payer: Self-pay

## 2021-05-06 ENCOUNTER — Ambulatory Visit
Admission: RE | Admit: 2021-05-06 | Discharge: 2021-05-06 | Disposition: A | Discharge status: Home / Self Care | Payer: Medicaid Other | Source: Ambulatory Visit | Attending: Emergency Medicine | Admitting: Emergency Medicine

## 2021-05-06 VITALS — BP 120/78 | HR 96 | Temp 99.6°F | Resp 18

## 2021-05-06 DIAGNOSIS — N898 Other specified noninflammatory disorders of vagina: Secondary | ICD-10-CM | POA: Insufficient documentation

## 2021-05-06 DIAGNOSIS — O3680X0 Pregnancy with inconclusive fetal viability, not applicable or unspecified: Secondary | ICD-10-CM

## 2021-05-06 NOTE — ED Triage Notes (Signed)
Pt here with vaginal irritation and itching x 7 days. Is using her old creams for previous BV infection, but doesn't know if it's that or not. Pt is pregnant.

## 2021-05-06 NOTE — ED Provider Notes (Signed)
SUBJECTIVE:  Denise [name] is a very pleasant 20 y.o. Female presents with concern due to vaginal irritation and itching x 7 days. Has been using her old BV creams. Patient is currently pregnant in her first trimester. No abnormal vaginal bleeding, malodor to discharge, dysuria, fever.  ROS: General/Constitutional: No fever, chills, or sweats GI: No abdominal pain, nausea/vomiting or diarrhea GU: No urinary frequency or dysuria Genitalia: As above Lymph: No swelling, red streaks or swollen lymph nodes Skin: No rashes or skin lesions   OBJECTIVE: Vitals:   05/06/21 1158  BP: 120/78  Pulse: 96  Resp: 18  Temp: 99.6 F (37.6 C)  SpO2: 97%     General: Appears well-developed and well-nourished. No acute distress.  Cardiovascular: Normal rate  Pulm/Chest: No respiratory distress. Neurological: Alert and oriented to person, place, and time.  Skin: Skin is warm and dry.  Psychiatric: Normal mood, affect, behavior, and thought content.  GU: Deferred secondary to self-collect specimen   Laboratory:  No orders of the defined types were placed in this encounter.  No results found for any visits on 05/06/21.   Assessment: 1. Vaginal itching - Cervicovaginal ancillary only; Standing - Cervicovaginal ancillary only  Plan:  If new medications were prescribed and/or administered during this visit Instructions about new medications and side effects provided.  If any changes in chronic medications then new reconciled medication list is given to patient.    MDM: Patient presents with vaginal irritation and itching x 7 days. Has been using her old BV creams. Patient is currently pregnant in her first trimester. No abnormal vaginal bleeding, malodor to discharge, dysuria, fever.  Patient reports that she has not tried anything over-the-counter as she does not want to do anything until she knows the etiology for her current symptoms.  Obtained APTIMA swab in clinic today.  Advised that  it would take about 2 to 3 days for these results to upload to her MyChart.  Advised that we would treat any positive results from this test.  Patient does have an appointment with her OB/GYN for an ultrasound on Friday.  Patient verbalized understanding and agreed with plan.  Patient stable upon discharge.  Return as needed.  Instructions:    Discharge Instructions      Your testing has been sent to the lab.  If your testing results as positive, you will be notified via phone and we will initiate treatment at that time.  If you do not receive a phone call from [place] within the next 2 to 3 days, check your MyChart for updated health information. Do not engage in sex until you know your results. If you test positive you will need to abstain from sex for 7 days and notify all partners.          Amalia Greenhouse, FNP 05/06/21 1212

## 2021-05-06 NOTE — Discharge Instructions (Addendum)
Your testing has been sent to the lab.  If your testing results as positive, you will be notified via phone and we will initiate treatment at that time.  If you do not receive a phone call from [place] within the next 2 to 3 days, check your MyChart for updated health information. Do not engage in sex until you know your results. If you test positive you will need to abstain from sex for 7 days and notify all partners.

## 2021-05-07 ENCOUNTER — Telehealth: Payer: Self-pay | Admitting: Emergency Medicine

## 2021-05-07 LAB — CERVICOVAGINAL ANCILLARY ONLY
Bacterial Vaginitis (gardnerella): POSITIVE — AB
Candida Glabrata: NEGATIVE
Candida Vaginitis: NEGATIVE
Chlamydia: NEGATIVE
Comment: NEGATIVE
Comment: NEGATIVE
Comment: NEGATIVE
Comment: NEGATIVE
Comment: NEGATIVE
Comment: NORMAL
Neisseria Gonorrhea: NEGATIVE
Trichomonas: NEGATIVE

## 2021-05-07 LAB — BETA HCG QUANT (REF LAB): hCG Quant: 5091 m[IU]/mL

## 2021-05-07 MED ORDER — METRONIDAZOLE 0.75 % VA GEL: 1.0000 | Freq: Every day | VAGINAL | 0 refills | Status: AC

## 2021-05-08 ENCOUNTER — Encounter: Payer: Self-pay | Admitting: Obstetrics and Gynecology

## 2021-05-08 ENCOUNTER — Ambulatory Visit (INDEPENDENT_AMBULATORY_CARE_PROVIDER_SITE_OTHER): Payer: Medicaid Other | Admitting: Obstetrics and Gynecology

## 2021-05-08 ENCOUNTER — Other Ambulatory Visit: Payer: Self-pay

## 2021-05-08 ENCOUNTER — Ambulatory Visit: Payer: Medicaid Other | Admitting: Obstetrics and Gynecology

## 2021-05-08 VITALS — BP 122/76 | Ht 61.5 in | Wt 260.0 lb

## 2021-05-08 DIAGNOSIS — O3680X0 Pregnancy with inconclusive fetal viability, not applicable or unspecified: Secondary | ICD-10-CM | POA: Diagnosis not present

## 2021-05-08 NOTE — Progress Notes (Signed)
Obstetrics & Gynecology Office Visit   Chief Complaint  Patient presents with   Follow-up    Beta HCG per CRS - RM 7   History of Present Illness: 20 y.o. G1P0 female who presents in follow up for a symptom check and hCG check. See Dr. Ellie Lunch note from 04/30/21.  She denies abdominal pain and vaginal bleeding. She has no concerns today and just wants to go home. She accepts getting another beta hCG check today. SHe he no questions and already has an ultrasound for date her pregnancy next week.  hCG values:  04/30/21: 756 05/04/21: 3,076 (307% increase) 05/06/21: 5,091 (65.5% increase)  Past Medical History:  Diagnosis Date   Premature baby    born at 69 weeks   Premature puberty    mother started menses at 49yrs.    No past surgical history on file.  Gynecologic History: Patient's last menstrual period was 02/27/2021 (approximate).  Obstetric History: G1P0  Family History  Problem Relation Age of Onset   Bipolar disorder Mother    Enuresis Sister        nocturnal    Social History   Socioeconomic History   Marital status: Single    Spouse name: Not on file   Number of children: Not on file   Years of education: Not on file   Highest education level: Not on file  Occupational History   Not on file  Tobacco Use   Smoking status: Never   Smokeless tobacco: Never  Vaping Use   Vaping Use: Never used  Substance and Sexual Activity   Alcohol use: Yes    Alcohol/week: 2.0 standard drinks    Types: 2 Shots of liquor per week    Comment: last use 02/23/21 q 3 mo   Drug use: No   Sexual activity: Yes    Partners: Male, Female  Other Topics Concern   Not on file  Social History Narrative   Not on file   Social Determinants of Health   Financial Resource Strain: Not on file  Food Insecurity: Not on file  Transportation Needs: Not on file  Physical Activity: Not on file  Stress: Not on file  Social Connections: Not on file  Intimate Partner Violence: Not  on file    Allergies  Allergen Reactions   Peanut-Containing Drug Products Swelling    Documented in [facility] ED record    Prior to Admission medications   Medication Sig Start Date End Date Taking? Authorizing Provider  metroNIDAZOLE (METROGEL VAGINAL) 0.75 % vaginal gel Place 1 Applicatorful vaginally at bedtime for 5 days. 05/07/21 05/12/21 Yes Lamptey, Britta Mccreedy, MD    Review of Systems  Constitutional: Negative.   HENT: Negative.    Eyes: Negative.   Respiratory: Negative.    Cardiovascular: Negative.   Gastrointestinal: Negative.   Genitourinary: Negative.   Musculoskeletal: Negative.   Skin: Negative.   Neurological: Negative.   Psychiatric/Behavioral: Negative.      Physical Exam BP 122/76   Ht 5' 1.5" (1.562 m)   Wt 260 lb (117.9 kg)   LMP 02/27/2021 (Approximate) Comment: Patient awaiting her first prenatal appt.  BMI 48.33 kg/m  Patient's last menstrual period was 02/27/2021 (approximate). Physical Exam Constitutional:      General: She is not in acute distress.    Appearance: Normal appearance.  HENT:     Head: Normocephalic and atraumatic.  Eyes:     General: No scleral icterus.    Conjunctiva/sclera: Conjunctivae normal.  Neurological:     General: No focal deficit present.     Mental Status: She is alert and oriented to person, place, and time.     Cranial Nerves: No cranial nerve deficit.  Psychiatric:        Mood and Affect: Mood normal.        Behavior: Behavior normal.        Judgment: Judgment normal.    Female chaperone present for pelvic and breast  portions of the physical exam  Assessment: 20 y.o. G1P0 female here for  1. Pregnancy of unknown anatomic location      Plan: Problem List Items Addressed This Visit   None Visit Diagnoses     Pregnancy of unknown anatomic location    -  Primary   Relevant Orders   Beta hCG quant (ref lab)      Follow up beta today.  So far trend has been essentially normal.  She has no symptoms. U/s scheduled for 05/13/21, which she states that she plans to attend.  Follow up based on ultrasound results.  Thomasene Mohair, MD 05/08/2021 10:28 AM

## 2021-05-09 LAB — BETA HCG QUANT (REF LAB): hCG Quant: 8778 m[IU]/mL

## 2021-05-13 ENCOUNTER — Other Ambulatory Visit: Payer: Self-pay

## 2021-05-13 ENCOUNTER — Ambulatory Visit
Admission: RE | Admit: 2021-05-13 | Discharge: 2021-05-13 | Disposition: A | Discharge status: Home / Self Care | Payer: Medicaid Other | Source: Ambulatory Visit | Attending: Obstetrics and Gynecology | Admitting: Obstetrics and Gynecology

## 2021-05-13 DIAGNOSIS — O3680X0 Pregnancy with inconclusive fetal viability, not applicable or unspecified: Secondary | ICD-10-CM | POA: Insufficient documentation

## 2021-05-19 ENCOUNTER — Ambulatory Visit: Payer: Medicaid Other | Admitting: Obstetrics and Gynecology

## 2021-05-22 ENCOUNTER — Other Ambulatory Visit: Payer: Medicaid Other

## 2021-05-25 ENCOUNTER — Telehealth: Payer: Self-pay

## 2021-05-25 NOTE — Telephone Encounter (Signed)
Pt calling; used 3d yeast inf med and stopped it b/c she started spotting and a blood clot came out; is the three day too strong?  Does she need to do the 7 day instead?  What to do?  [phone]  Mom adv me to call back in 20 minutes.

## 2021-05-25 NOTE — Telephone Encounter (Signed)
Mailbox is full.

## 2021-05-26 ENCOUNTER — Telehealth: Payer: Self-pay

## 2021-05-26 NOTE — Telephone Encounter (Signed)
Pt called stating that she used the 3 day monistat but she had some bleeding so she stopped the medication. She's [redacted] weeks pregnant. No recent intercourse to cause the bleeding. The bleeding has stopped as of today but still having itching, can you send in a diflucan to [facility] on [address]?

## 2021-05-27 NOTE — Telephone Encounter (Signed)
No- diflucan is to be avoided in early pregnancy. She had BV 3 weeks ago on a swab and was treated, used monistat or come for evaluation of vaginitis.

## 2021-05-29 NOTE — Telephone Encounter (Signed)
Pt calling to see if rx for yeast inf was sent in.  [phone]  Adv pt per CRS diflucan is to be avoided in early preg; to use monistat or be seen.  Pt wants to try 7d monistat since it's not as strong as 3d.  Adv she could try it and if it doesn't help to be seen and not to use anything the night before she is seen.

## 2021-05-29 NOTE — Telephone Encounter (Signed)
Please see 05/29/21 message.

## 2021-06-23 ENCOUNTER — Other Ambulatory Visit: Payer: Self-pay

## 2021-06-23 ENCOUNTER — Ambulatory Visit: Payer: Medicaid Other | Admitting: Family Medicine

## 2021-06-23 ENCOUNTER — Encounter: Payer: Self-pay | Admitting: Family Medicine

## 2021-06-23 DIAGNOSIS — Z113 Encounter for screening for infections with a predominantly sexual mode of transmission: Secondary | ICD-10-CM | POA: Diagnosis not present

## 2021-06-23 DIAGNOSIS — B3731 Acute candidiasis of vulva and vagina: Secondary | ICD-10-CM

## 2021-06-23 LAB — WET PREP FOR TRICH, YEAST, CLUE
Trichomonas Exam: NEGATIVE
Yeast Exam: NEGATIVE

## 2021-06-23 MED ORDER — CLOTRIMAZOLE 1 % VA CREA: 1.0000 | TOPICAL_CREAM | Freq: Every day | VAGINAL | 0 refills | Status: AC

## 2021-06-23 NOTE — Progress Notes (Signed)
Pt here for STD screening.  Wet mount results reviewed.  Medication dispensed per Provider orders. Pt declined condoms. Giorgia Wahler M Joal Eakle, RN  

## 2021-06-23 NOTE — Progress Notes (Signed)
Pt is pregnant

## 2021-06-23 NOTE — Progress Notes (Signed)
[facility] Department  STI clinic/screening visit Denise [name] [id number] [phone]  Subjective:  Denise [name] is a 21 y.o. female being seen today for an STI screening visit. The patient reports they do have symptoms.  Patient reports that they  currently are pregnant     They reported they are not interested in discussing contraception today.    Patient's last menstrual period was 02/27/2021 (approximate).   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  .   Screening for MPX risk: Does the patient have an unexplained rash? No Is the patient MSM? No Does the patient endorse multiple sex partners or anonymous sex partners? No Did the patient have close or sexual contact with a person diagnosed with MPX? No Has the patient traveled outside the [place] where MPX is endemic? No Is there a high clinical suspicion for MPX-- evidenced by one of the following No  -Unlikely to be chickenpox  -Lymphadenopathy  -Rash that present in same phase of evolution on any given body part See flowsheet for further details and programmatic requirements.    The following portions of the patient's history were reviewed and updated as appropriate: allergies, current medications, past medical history, past social history, past surgical history and problem list.  Objective:  There were no vitals  filed for this visit.  Physical Exam Vitals and nursing note reviewed.  Constitutional:      Appearance: Normal appearance. She is obese.  HENT:     Head: Normocephalic and atraumatic.     Mouth/Throat:     Mouth: Mucous membranes are moist.     Pharynx: Oropharynx is clear. No oropharyngeal exudate or posterior oropharyngeal erythema.  Pulmonary:     Effort: Pulmonary effort is normal.  Abdominal:     General: Abdomen is flat.     Palpations: There is no mass.     Tenderness: There is no abdominal tenderness. There is no rebound.  Genitourinary:    General: Normal vulva.     Exam position: Lithotomy position.     Pubic Area: No rash or pubic lice.      Labia:        Right: No rash or lesion.        Left: No rash or lesion.      Vagina: Normal. No vaginal discharge, erythema, bleeding or lesions.     Cervix: No cervical motion tenderness, discharge, friability, lesion or erythema.     Uterus: Normal.      Adnexa: Right adnexa normal and left adnexa normal.     Rectum: Normal.     Comments: External genitalia without, lice, nits, erythema, edema , lesions or inguinal adenopathy. Vagina with normal mucosa and thick discharge and pH equals 4.  Cervix without visual lesions, uterus firm, mobile, non-tender, no masses, CMT adnexal fullness or tenderness.   Lymphadenopathy:     Head:  Right side of head: No preauricular or posterior auricular adenopathy.     Left side of head: No preauricular or posterior auricular adenopathy.     Cervical: No cervical adenopathy.     Upper Body:     Right upper body: No supraclavicular or axillary adenopathy.     Left upper body: No supraclavicular or axillary adenopathy.     Lower Body: No right inguinal adenopathy. No left inguinal adenopathy.  Skin:    General: Skin is warm and dry.     Findings: No rash.  Neurological:     Mental Status: She is alert and oriented to person, place, and time.  Psychiatric:        Mood and Affect: Mood  normal.        Behavior: Behavior normal.     Assessment and Plan:  Denise [name] is a 21 y.o. female presenting to the [facility] Department for STI screening  1. Screening examination for venereal disease Patient accepted all screenings including wet prep, oral, vaginal CT/GC and bloodwork for HIV/RPR.  Patient meets criteria for HepB screening? No. Ordered? No - does not meet criteria  Patient meets criteria for HepC screening? No. Ordered? No - does not meet criteria   Wet prep results neg    Treatment needed for yeast  Discussed time line for State Lab results and that patient will be called with positive results and encouraged patient to call if she had not heard in 2 weeks.  Counseled to return or seek care for continued or worsening symptoms Recommended condom use with all sex  Patient is currently using  no BCM  to prevent pregnancy.  - Chlamydia/Gonorrhea [facility] - WET PREP FOR [place], YEAST, CLUE  2. Yeast vaginitis Yeast on assessment  - clotrimazole (V-R CLOTRIMAZOLE VAGINAL) 1 % vaginal cream; Place 1 Applicatorful vaginally at bedtime for 7 days.  Dispense: 45 g; Refill: 0    No follow-ups on file.  No future appointments.  Junious Dresser, FNP

## 2021-07-17 DIAGNOSIS — O0993 Supervision of high risk pregnancy, unspecified, third trimester: Secondary | ICD-10-CM | POA: Insufficient documentation

## 2021-08-06 LAB — OB RESULTS CONSOLE HEPATITIS B SURFACE ANTIGEN: Hepatitis B Surface Ag: NEGATIVE

## 2021-08-06 LAB — OB RESULTS CONSOLE RUBELLA ANTIBODY, IGM: Rubella: IMMUNE

## 2021-08-06 LAB — OB RESULTS CONSOLE VARICELLA ZOSTER ANTIBODY, IGG: Varicella: IMMUNE

## 2021-08-27 ENCOUNTER — Other Ambulatory Visit: Payer: Self-pay | Admitting: Certified Nurse Midwife

## 2021-08-27 DIAGNOSIS — O99212 Obesity complicating pregnancy, second trimester: Secondary | ICD-10-CM

## 2021-09-08 ENCOUNTER — Other Ambulatory Visit: Payer: Medicaid Other

## 2021-09-10 ENCOUNTER — Inpatient Hospital Stay: Admission: RE | Admit: 2021-09-10 | Payer: Medicaid Other | Source: Ambulatory Visit

## 2021-09-24 ENCOUNTER — Ambulatory Visit: Payer: No Typology Code available for payment source

## 2021-10-01 ENCOUNTER — Other Ambulatory Visit: Payer: Self-pay

## 2021-10-01 ENCOUNTER — Ambulatory Visit: Payer: Medicaid Other | Attending: Obstetrics

## 2021-10-01 ENCOUNTER — Ambulatory Visit (HOSPITAL_BASED_OUTPATIENT_CLINIC_OR_DEPARTMENT_OTHER): Payer: Medicaid Other | Admitting: Obstetrics

## 2021-10-01 DIAGNOSIS — E669 Obesity, unspecified: Secondary | ICD-10-CM | POA: Diagnosis not present

## 2021-10-01 DIAGNOSIS — Z3A26 26 weeks gestation of pregnancy: Secondary | ICD-10-CM | POA: Diagnosis not present

## 2021-10-01 DIAGNOSIS — Z363 Encounter for antenatal screening for malformations: Secondary | ICD-10-CM | POA: Insufficient documentation

## 2021-10-01 DIAGNOSIS — O10012 Pre-existing essential hypertension complicating pregnancy, second trimester: Secondary | ICD-10-CM

## 2021-10-01 DIAGNOSIS — O280 Abnormal hematological finding on antenatal screening of mother: Secondary | ICD-10-CM

## 2021-10-01 DIAGNOSIS — O289 Unspecified abnormal findings on antenatal screening of mother: Secondary | ICD-10-CM | POA: Diagnosis not present

## 2021-10-01 DIAGNOSIS — O99212 Obesity complicating pregnancy, second trimester: Secondary | ICD-10-CM | POA: Diagnosis not present

## 2021-10-01 DIAGNOSIS — O10912 Unspecified pre-existing hypertension complicating pregnancy, second trimester: Secondary | ICD-10-CM

## 2021-10-01 NOTE — Progress Notes (Signed)
MFM Note ? ?Denise [name] was seen for a detailed fetal anatomy scan due to maternal obesity with a BMI of greater than 50 and chronic hypertension that is not treated with any medications.  She is taking a daily baby aspirin for preeclampsia prophylaxis. ? ?She denies any problems in her current pregnancy.   ? ?She had a cell free DNA test earlier in her pregnancy which indicated a low risk for trisomy 18, 42, and 13. A female fetus is predicted. ? ?She was informed that the fetal growth and amniotic fluid level were appropriate for her gestational age.  ? ?There were no obvious fetal anomalies noted on today's ultrasound exam.  However, today's exam was limited due to the fetal position and maternal body habitus. ? ?The patient was informed that anomalies may be missed due to technical limitations. If the fetus is in a suboptimal position or maternal habitus is increased, visualization of the fetus in the maternal uterus may be impaired. ? ?The following were discussed during today's consultation: ? ?Chronic hypertension in pregnancy ? ?The implications and management of chronic hypertension in pregnancy was discussed.  ? ?The patient was advised that should her blood pressures be elevated later in her pregnancy, she may have to be started on antihypertensive medication. ? ?The increased risk of superimposed preeclampsia and possible fetal growth restriction due to chronic hypertension in pregnancy was discussed.  ?  ?We will continue to follow her with monthly growth scans.  ? ?To decrease her risk of superimposed preeclampsia, she should continue taking a daily baby aspirin (81 mg daily) for preeclampsia prophylaxis.  ? ?Obesity in pregnancy ? ?The recommended total weight gain in pregnancy for obese women's between 10 to 20 pounds. ? ?Due to obesity, she should be screened for gestational diabetes soon.  ?  ?As maternal obesity may present challenges associated with the management of anesthesia, an  anesthesia consult should be obtained when she is admitted in labor. ? ?The patient stated that all of her questions have been answered to her satisfaction.   ? ?A follow-up growth scan scheduled in 4 weeks.  We will try to complete the views of the fetal anatomy at that exam. ? ?A total of 30 minutes was spent counseling and coordinating the care for this patient.  Greater than 50% of the time was spent in direct face-to-face contact.  ?

## 2021-10-26 ENCOUNTER — Other Ambulatory Visit: Payer: Self-pay

## 2021-10-27 ENCOUNTER — Other Ambulatory Visit: Payer: Self-pay

## 2021-10-27 DIAGNOSIS — O99213 Obesity complicating pregnancy, third trimester: Secondary | ICD-10-CM

## 2021-10-27 DIAGNOSIS — O10913 Unspecified pre-existing hypertension complicating pregnancy, third trimester: Secondary | ICD-10-CM

## 2021-10-28 ENCOUNTER — Encounter: Payer: Self-pay | Admitting: Urgent Care

## 2021-10-29 ENCOUNTER — Other Ambulatory Visit: Payer: Self-pay

## 2021-10-29 ENCOUNTER — Encounter
Admission: RE | Admit: 2021-10-29 | Discharge: 2021-10-29 | Disposition: A | Discharge status: Home / Self Care | Payer: Medicaid Other | Source: Ambulatory Visit | Attending: Urgent Care | Admitting: Urgent Care

## 2021-10-29 ENCOUNTER — Ambulatory Visit: Payer: Medicaid Other | Attending: Maternal & Fetal Medicine

## 2021-10-29 DIAGNOSIS — E669 Obesity, unspecified: Secondary | ICD-10-CM

## 2021-10-29 DIAGNOSIS — O99213 Obesity complicating pregnancy, third trimester: Secondary | ICD-10-CM | POA: Diagnosis not present

## 2021-10-29 DIAGNOSIS — O289 Unspecified abnormal findings on antenatal screening of mother: Secondary | ICD-10-CM | POA: Insufficient documentation

## 2021-10-29 DIAGNOSIS — O10013 Pre-existing essential hypertension complicating pregnancy, third trimester: Secondary | ICD-10-CM | POA: Diagnosis not present

## 2021-10-29 DIAGNOSIS — Z3A30 30 weeks gestation of pregnancy: Secondary | ICD-10-CM | POA: Insufficient documentation

## 2021-10-29 DIAGNOSIS — Z363 Encounter for antenatal screening for malformations: Secondary | ICD-10-CM | POA: Insufficient documentation

## 2021-10-29 DIAGNOSIS — O10913 Unspecified pre-existing hypertension complicating pregnancy, third trimester: Secondary | ICD-10-CM

## 2021-10-29 HISTORY — DX: Tachycardia, unspecified: R00.0

## 2021-10-29 HISTORY — DX: Anemia complicating pregnancy, third trimester: O99.013

## 2021-10-29 NOTE — Consult Note (Signed)
?Perioperative Services ? ?OB/GYN PRE-ANESTHESIA CONSULTATION ? ?Date: 10/29/21 ? ?Patient Demographics:  ?Name: Denise [name] ?DOB:   April 30, 2001 ?MRN:   [id number] ? ?NOTE: Available PAT nursing documentation and vital signs have been reviewed. Available clinical information reviewed in efforts to assist in medical decision making as it pertains to the aforementioned procedure and anticipated anesthetic course.  ? ?Pertinent OB/GYN Information:  ?Birth plan as of 10/29/21: patient plans to deliver vaginally with epidural placement for intrapartum analgesia.  ? ?OB History   ? ? Gravida  ?1  ? Para  ?   ? Term  ?   ? Preterm  ?   ? AB  ?   ? Living  ?   ?  ? ? SAB  ?   ? IAB  ?   ? Ectopic  ?   ? Multiple  ?   ? Live Births  ?   ?   ?  ?Clinical Discussion:  ?Denise [name] is a 21 y.o. female presenting for pre-anesthesia evaluation prior to planned vaginal delivery with [facility] OB/GYN providers. Patient is a [redacted]w[redacted]d primigravid with an EDC of 01/06/2022. Due to a documented history of obesity, she has been submitted for pre-delivery consultation with anesthesia team. ? ?Patient has never been a smoker. She denies the use/abuse of any type of substances, including ETOH during this pregnancy. Pertinent PMH includes: PTSD, ADHD, maternal tachycardia during pregnancy, and 3rd trimester anemia.  ? ?Patient was last seen in the OB/GYN clinic on 10/21/2021; notes reviewed. Intrauterine pregnancy (IUP) has been confirmed by her OB/GYN team via appropriate ultrasound imaging. Patient found to be mildly anemic on most recent CBC performed on 10/21/2021; hemoglobin 10.4 g/dL, hematocrit 26.9%, MCV 73.3, MCH 22.6, and platelets 266,000. CBC consistent with a microcytic hypochromic anemia (mostly likely iron deficiency), therefore she was started on daily oral iron supplement.  Nulliparity and obesity (BMI > 30) place patient at a moderate risk of developing preeclampsia, therefore she has been started on daily  low-dose ASA therapy by her OB providers. Patient also noted to be tachycardic with a heart rate of 132 bpm.  This was felt to be related to normal physiological changes associated with pregnancy, coupled with the effects of her anemia. Baseline ECG was performed on 10/22/2021 showing NSR with no significant ST or T wave changes at a rate of 95 bpm. ? ?She denies any known complications associated with her current pregnancy. Patient advising that she has received appropriate prenatal care throughout her current pregnancy and verbalizes that everything is on track for her to deliver per her original birth plan. Patient excited to report that fetus has been in vertex position since at least 26 weeks. Patient consult being performed due to concerns related to high maternal BMI. Per patient report, she has gained approximately 20 lbs during the course of her pregnancy. Last blood pressure 117/83 mmHg in OB clinic on 10/21/2021. GTT (+); last HgbA1c was 6.0%; no official diagnosis of T2DM or gestational diabetes.  ? ?Patient advises that she does not have any type of known issues with her cervical or lumbar spine. ? ?She has not had any type of surgical procedures, whether with or without hardware placement, on her neck or lower back.  ? ?Patient denies previous perioperative complications with anesthesia in the past. She has only received sedation for wisdom tooth extraction.  ? ?She has never been intubated. She still has her tonsils/adenoids. She advises that she has never been advised by  a medical provider that she had a challenging airway due to her anatomy.  ? ?Patient reports that she has never undergone an epidural or neuraxial anesthetic course is the past. ? ?Patient has never been diagnosed with OSAH syndrome requiring the use of prescribed nocturnal PAP therapy.  ? ?Patient denies previous perioperative complications with anesthesia in the past.  In review her EMR, there are no records available for review  pertaining to past procedural/anesthetic courses within the [facility] system. ? ?Providers/Specialists:  ? ?NOTE: Primary physician provider listed below. Patient may have been seen by APP or partner within same practice.  ? ?PROVIDER ROLE / SPECIALTY LAST OV  ?Heloise Ochoa, CNM OB/GYN APP 10/21/2021  ?Pcp, No Primary Care Provider ???  ? ?Allergies:  ?Peanut-containing drug products ? ?Current Home Medications:  ? ? aspirin 81 MG chewable tablet  ? Prenatal Vit-DSS-Fe Fum-FA (PRENATAL 19) tablet  ? ?No current facility-administered medications for this encounter.  ? ?History:  ? ?Past Medical History:  ?Diagnosis Date  ? ADHD   ? Anemia affecting pregnancy in third trimester   ? Premature baby   ? a.) patient born at 22 weeks  ? Premature puberty   ? a.) maternal menarche was at 21 years of age  ? PTSD (post-traumatic stress disorder)   ? Tachycardia   ? ?Past Surgical History:  ?Procedure Laterality Date  ? WISDOM TOOTH EXTRACTION Bilateral   ? ?Family History  ?Problem Relation Age of Onset  ? Bipolar disorder Mother   ? Enuresis Sister   ? ?Social History  ? ?Tobacco Use  ? Smoking status: Never  ? Smokeless tobacco: Never  ?Vaping Use  ? Vaping Use: Never used  ?Substance Use Topics  ? Alcohol use: Not Currently  ?  Alcohol/week: 2.0 standard drinks  ?  Types: 2 Shots of liquor per week  ?  Comment: last use 02/23/21 q 3 mo  ? Drug use: No  ? ?Review of Systems:  ?Review of Systems  ?Constitutional: Negative.   ?HENT: Negative.    ?Eyes: Negative.   ?Respiratory:  Negative for cough, shortness of breath and wheezing.   ?Cardiovascular:  Negative for chest pain, palpitations and leg swelling.  ?Gastrointestinal:  Positive for abdominal pain (intermittent "twinges" of short duration self limiting pain.). Negative for heartburn, nausea and vomiting.  ?Musculoskeletal:  Negative for back pain, falls and neck pain.  ?Skin:  Negative for rash.  ?Neurological:  Negative for dizziness and headaches.   ?Endo/Heme/Allergies: Negative.   ?Psychiatric/Behavioral:  Negative for depression, substance abuse and suicidal ideas. The patient is not nervous/anxious.   ? ?Physical Examination:  ? ?Vitals:  ? 10/29/21 0900  ?Pulse: (!) 112  ?Resp: 20  ? ?Estimated body mass index is 51.43 kg/m? as calculated from the following: ?  Height as of this encounter: 5\' 1"  (1.549 m). ?  Weight as of this encounter: 123.5 kg. ? ? ? ?Physical Exam ?Vitals reviewed.  ?Constitutional:   ?   Appearance: Normal appearance. She is obese.  ?HENT:  ?   Head: Normocephalic and atraumatic.  ?   Mouth/Throat:  ?   Mouth: Mucous membranes are moist.  ?   Pharynx: Oropharynx is clear.  ?   Comments: Mallampati: Class 2: Can visualize soft palate and fauces, tip of uvula is obscured. ?Thyromental distance: > 3 FB/cm ?Neck:  ?   Comments: FROM without stiffness or pain. Able to touch chin to chest. ?Cardiovascular:  ?   Rate and Rhythm: Regular  rhythm. Tachycardia present.  ?   Pulses: Normal pulses.  ?   Heart sounds: Normal heart sounds.  ?   Comments: (+) h/o maternal tachycardia; HR 112 bpm today. ECG 10/22/2021 showed NSR with no significant ST or T wave abnormalities at a rate of 95 bpm. ?Pulmonary:  ?   Effort: Pulmonary effort is normal.  ?   Breath sounds: Normal breath sounds.  ?Abdominal:  ?   Comments: (+) gravid abdomen without complaints of pain at time of exam. (+) fetal movement; level of activity at baseline (not decreased).  ?Genitourinary: ?   Comments: Exam deferred; denies vaginal pain/discharge/bleeding.  ?Musculoskeletal:     ?   General: No swelling. Normal range of motion.  ?   Cervical back: Neck supple. No rigidity or tenderness.  ?Skin: ?   General: Skin is warm and dry.  ?   Findings: No rash.  ?Neurological:  ?   General: No focal deficit present.  ?   Mental Status: She is alert and oriented to person, place, and time. Mental status is at baseline.  ?   Gait: Gait normal.  ?Psychiatric:     ?   Mood and Affect: Mood  normal.     ?   Behavior: Behavior normal.     ?   Thought Content: Thought content normal.     ?   Judgment: Judgment normal.  ? ?Pertinent Clinical Results:  ?LABS:  ? Ref Range & Units 10/21/2021  ?WBC (White Blood Cell

## 2021-11-10 ENCOUNTER — Other Ambulatory Visit: Payer: Self-pay

## 2021-11-10 DIAGNOSIS — O10913 Unspecified pre-existing hypertension complicating pregnancy, third trimester: Secondary | ICD-10-CM

## 2021-11-10 DIAGNOSIS — O99213 Obesity complicating pregnancy, third trimester: Secondary | ICD-10-CM

## 2021-11-12 ENCOUNTER — Other Ambulatory Visit: Payer: Self-pay

## 2021-11-12 ENCOUNTER — Other Ambulatory Visit: Payer: Self-pay | Admitting: Obstetrics and Gynecology

## 2021-11-12 ENCOUNTER — Ambulatory Visit: Payer: Medicaid Other | Attending: Obstetrics and Gynecology

## 2021-11-12 ENCOUNTER — Ambulatory Visit (HOSPITAL_BASED_OUTPATIENT_CLINIC_OR_DEPARTMENT_OTHER): Payer: Medicaid Other | Admitting: Obstetrics and Gynecology

## 2021-11-12 DIAGNOSIS — O10913 Unspecified pre-existing hypertension complicating pregnancy, third trimester: Secondary | ICD-10-CM

## 2021-11-12 DIAGNOSIS — O288 Other abnormal findings on antenatal screening of mother: Secondary | ICD-10-CM | POA: Diagnosis not present

## 2021-11-12 DIAGNOSIS — E669 Obesity, unspecified: Secondary | ICD-10-CM | POA: Insufficient documentation

## 2021-11-12 DIAGNOSIS — O99213 Obesity complicating pregnancy, third trimester: Secondary | ICD-10-CM

## 2021-11-12 DIAGNOSIS — O10013 Pre-existing essential hypertension complicating pregnancy, third trimester: Secondary | ICD-10-CM

## 2021-11-12 DIAGNOSIS — Z3A32 32 weeks gestation of pregnancy: Secondary | ICD-10-CM

## 2021-11-12 NOTE — Procedures (Signed)
Denise [name] June 25, 2000 [redacted]w[redacted]d  Fetus A Non-Stress Test Interpretation for 11/12/21  Indication: Chronic Hypertenstion  Fetal Heart Rate A Mode: External Baseline Rate (A): 130 bpm Variability: Moderate Accelerations: 15 x 15 Decelerations: None Multiple birth?: No  Uterine Activity Mode: Toco  Interpretation (Fetal Testing) Nonstress Test Interpretation: Reactive (Dr. Donalee Citrin read NST.  Pia Mau, RN performed NST.)

## 2021-11-17 ENCOUNTER — Other Ambulatory Visit: Payer: Self-pay

## 2021-11-17 DIAGNOSIS — O99213 Obesity complicating pregnancy, third trimester: Secondary | ICD-10-CM

## 2021-11-17 DIAGNOSIS — O163 Unspecified maternal hypertension, third trimester: Secondary | ICD-10-CM

## 2021-11-19 ENCOUNTER — Other Ambulatory Visit: Payer: Self-pay

## 2021-11-19 ENCOUNTER — Ambulatory Visit: Payer: Medicaid Other | Attending: Maternal & Fetal Medicine

## 2021-11-19 DIAGNOSIS — O10013 Pre-existing essential hypertension complicating pregnancy, third trimester: Secondary | ICD-10-CM | POA: Diagnosis present

## 2021-11-19 DIAGNOSIS — O99213 Obesity complicating pregnancy, third trimester: Secondary | ICD-10-CM | POA: Diagnosis not present

## 2021-11-19 DIAGNOSIS — O163 Unspecified maternal hypertension, third trimester: Secondary | ICD-10-CM

## 2021-11-19 DIAGNOSIS — O289 Unspecified abnormal findings on antenatal screening of mother: Secondary | ICD-10-CM | POA: Insufficient documentation

## 2021-11-19 DIAGNOSIS — Z3A33 33 weeks gestation of pregnancy: Secondary | ICD-10-CM | POA: Insufficient documentation

## 2021-11-24 ENCOUNTER — Other Ambulatory Visit: Payer: Self-pay

## 2021-11-24 DIAGNOSIS — O99213 Obesity complicating pregnancy, third trimester: Secondary | ICD-10-CM

## 2021-11-24 DIAGNOSIS — O163 Unspecified maternal hypertension, third trimester: Secondary | ICD-10-CM

## 2021-11-26 ENCOUNTER — Other Ambulatory Visit: Payer: Self-pay | Admitting: Maternal & Fetal Medicine

## 2021-11-26 ENCOUNTER — Other Ambulatory Visit: Payer: Self-pay

## 2021-11-26 ENCOUNTER — Ambulatory Visit: Payer: Medicaid Other | Attending: Obstetrics

## 2021-11-26 ENCOUNTER — Ambulatory Visit: Payer: Medicaid Other | Admitting: Obstetrics

## 2021-11-26 DIAGNOSIS — O163 Unspecified maternal hypertension, third trimester: Secondary | ICD-10-CM

## 2021-11-26 DIAGNOSIS — O99213 Obesity complicating pregnancy, third trimester: Secondary | ICD-10-CM

## 2021-11-26 DIAGNOSIS — Z3A34 34 weeks gestation of pregnancy: Secondary | ICD-10-CM | POA: Insufficient documentation

## 2021-11-26 NOTE — Procedures (Signed)
Denise [name] 10-31-2000 [redacted]w[redacted]d  Fetus A Non-Stress Test Interpretation for 11/26/21  Indication: Chronic Hypertenstion  Fetal Heart Rate A Mode: External Baseline Rate (A): 140 bpm Variability: Moderate Accelerations: 15 x 15 Decelerations: None Multiple birth?: No  Uterine Activity Mode: Toco  Interpretation (Fetal Testing) Nonstress Test Interpretation: Reactive (Per Dr. Parke Poisson)

## 2021-12-08 ENCOUNTER — Other Ambulatory Visit: Payer: Self-pay

## 2021-12-08 DIAGNOSIS — F912 Conduct disorder, adolescent-onset type: Secondary | ICD-10-CM

## 2021-12-08 DIAGNOSIS — O163 Unspecified maternal hypertension, third trimester: Secondary | ICD-10-CM

## 2021-12-08 DIAGNOSIS — O99213 Obesity complicating pregnancy, third trimester: Secondary | ICD-10-CM

## 2021-12-08 DIAGNOSIS — F4310 Post-traumatic stress disorder, unspecified: Secondary | ICD-10-CM

## 2021-12-09 LAB — OB RESULTS CONSOLE HIV ANTIBODY (ROUTINE TESTING): HIV: NONREACTIVE

## 2021-12-09 LAB — OB RESULTS CONSOLE GC/CHLAMYDIA
Chlamydia: NEGATIVE
Neisseria Gonorrhea: NEGATIVE

## 2021-12-09 LAB — OB RESULTS CONSOLE GBS: GBS: NEGATIVE

## 2021-12-09 LAB — OB RESULTS CONSOLE RPR: RPR: NONREACTIVE

## 2021-12-10 ENCOUNTER — Other Ambulatory Visit: Payer: Medicaid Other

## 2021-12-10 ENCOUNTER — Ambulatory Visit: Payer: Medicaid Other | Attending: Maternal & Fetal Medicine

## 2021-12-10 ENCOUNTER — Other Ambulatory Visit: Payer: Self-pay

## 2021-12-10 DIAGNOSIS — O10913 Unspecified pre-existing hypertension complicating pregnancy, third trimester: Secondary | ICD-10-CM | POA: Insufficient documentation

## 2021-12-10 DIAGNOSIS — Z3A36 36 weeks gestation of pregnancy: Secondary | ICD-10-CM | POA: Diagnosis not present

## 2021-12-10 DIAGNOSIS — F4310 Post-traumatic stress disorder, unspecified: Secondary | ICD-10-CM | POA: Diagnosis not present

## 2021-12-10 DIAGNOSIS — O99213 Obesity complicating pregnancy, third trimester: Secondary | ICD-10-CM | POA: Diagnosis not present

## 2021-12-10 DIAGNOSIS — O10013 Pre-existing essential hypertension complicating pregnancy, third trimester: Secondary | ICD-10-CM

## 2021-12-10 DIAGNOSIS — O289 Unspecified abnormal findings on antenatal screening of mother: Secondary | ICD-10-CM | POA: Diagnosis not present

## 2021-12-10 DIAGNOSIS — O99343 Other mental disorders complicating pregnancy, third trimester: Secondary | ICD-10-CM | POA: Diagnosis not present

## 2021-12-10 DIAGNOSIS — F912 Conduct disorder, adolescent-onset type: Secondary | ICD-10-CM

## 2021-12-10 DIAGNOSIS — O163 Unspecified maternal hypertension, third trimester: Secondary | ICD-10-CM

## 2021-12-15 ENCOUNTER — Other Ambulatory Visit: Payer: Self-pay

## 2021-12-15 DIAGNOSIS — O10913 Unspecified pre-existing hypertension complicating pregnancy, third trimester: Secondary | ICD-10-CM

## 2021-12-15 DIAGNOSIS — O163 Unspecified maternal hypertension, third trimester: Secondary | ICD-10-CM

## 2021-12-15 DIAGNOSIS — O99013 Anemia complicating pregnancy, third trimester: Secondary | ICD-10-CM

## 2021-12-17 ENCOUNTER — Other Ambulatory Visit: Payer: Medicaid Other

## 2021-12-17 ENCOUNTER — Other Ambulatory Visit: Payer: Self-pay

## 2021-12-17 DIAGNOSIS — O99213 Obesity complicating pregnancy, third trimester: Secondary | ICD-10-CM

## 2021-12-17 DIAGNOSIS — O10913 Unspecified pre-existing hypertension complicating pregnancy, third trimester: Secondary | ICD-10-CM

## 2021-12-17 NOTE — Progress Notes (Deleted)
Pt was a "No Show" for her MFM at [facility] appt today.

## 2021-12-24 ENCOUNTER — Ambulatory Visit (HOSPITAL_BASED_OUTPATIENT_CLINIC_OR_DEPARTMENT_OTHER): Payer: Medicaid Other | Admitting: Obstetrics

## 2021-12-24 ENCOUNTER — Inpatient Hospital Stay: Payer: Medicaid Other | Admitting: Anesthesiology

## 2021-12-24 ENCOUNTER — Other Ambulatory Visit: Payer: Self-pay

## 2021-12-24 ENCOUNTER — Inpatient Hospital Stay
Admission: RE | Admit: 2021-12-24 | Discharge: 2021-12-26 | DRG: 788 | Disposition: A | Discharge status: Home / Self Care | Payer: Medicaid Other | Attending: Obstetrics | Admitting: Obstetrics

## 2021-12-24 ENCOUNTER — Ambulatory Visit (HOSPITAL_BASED_OUTPATIENT_CLINIC_OR_DEPARTMENT_OTHER): Payer: Medicaid Other

## 2021-12-24 ENCOUNTER — Other Ambulatory Visit: Payer: Self-pay | Admitting: Obstetrics

## 2021-12-24 ENCOUNTER — Encounter: Payer: Self-pay | Admitting: Obstetrics and Gynecology

## 2021-12-24 DIAGNOSIS — O365930 Maternal care for other known or suspected poor fetal growth, third trimester, not applicable or unspecified: Secondary | ICD-10-CM

## 2021-12-24 DIAGNOSIS — D62 Acute posthemorrhagic anemia: Secondary | ICD-10-CM | POA: Diagnosis not present

## 2021-12-24 DIAGNOSIS — E669 Obesity, unspecified: Secondary | ICD-10-CM

## 2021-12-24 DIAGNOSIS — O99213 Obesity complicating pregnancy, third trimester: Secondary | ICD-10-CM

## 2021-12-24 DIAGNOSIS — Z3A38 38 weeks gestation of pregnancy: Secondary | ICD-10-CM

## 2021-12-24 DIAGNOSIS — Z3490 Encounter for supervision of normal pregnancy, unspecified, unspecified trimester: Secondary | ICD-10-CM | POA: Diagnosis present

## 2021-12-24 DIAGNOSIS — O134 Gestational [pregnancy-induced] hypertension without significant proteinuria, complicating childbirth: Secondary | ICD-10-CM | POA: Diagnosis present

## 2021-12-24 DIAGNOSIS — O99214 Obesity complicating childbirth: Secondary | ICD-10-CM | POA: Diagnosis present

## 2021-12-24 DIAGNOSIS — O9081 Anemia of the puerperium: Secondary | ICD-10-CM | POA: Diagnosis not present

## 2021-12-24 DIAGNOSIS — O365931 Maternal care for other known or suspected poor fetal growth, third trimester, fetus 1: Secondary | ICD-10-CM | POA: Insufficient documentation

## 2021-12-24 DIAGNOSIS — O10013 Pre-existing essential hypertension complicating pregnancy, third trimester: Secondary | ICD-10-CM

## 2021-12-24 DIAGNOSIS — O10913 Unspecified pre-existing hypertension complicating pregnancy, third trimester: Secondary | ICD-10-CM

## 2021-12-24 LAB — CBC
HCT: 35.7 % — ABNORMAL LOW (ref 36.0–46.0)
Hemoglobin: 11 g/dL — ABNORMAL LOW (ref 12.0–15.0)
MCH: 22.1 pg — ABNORMAL LOW (ref 26.0–34.0)
MCHC: 30.8 g/dL (ref 30.0–36.0)
MCV: 71.8 fL — ABNORMAL LOW (ref 80.0–100.0)
Platelets: 262 10*3/uL (ref 150–400)
RBC: 4.97 MIL/uL (ref 3.87–5.11)
RDW: 15.3 % (ref 11.5–15.5)
WBC: 5.4 10*3/uL (ref 4.0–10.5)
nRBC: 0 % (ref 0.0–0.2)

## 2021-12-24 LAB — TYPE AND SCREEN
ABO/RH(D): A POS
Antibody Screen: NEGATIVE

## 2021-12-24 LAB — PROTEIN / CREATININE RATIO, URINE
Creatinine, Urine: 159 mg/dL
Protein Creatinine Ratio: 0.12 mg/mg{Cre} (ref 0.00–0.15)
Total Protein, Urine: 19 mg/dL

## 2021-12-24 LAB — COMPREHENSIVE METABOLIC PANEL
ALT: 14 U/L (ref 0–44)
AST: 18 U/L (ref 15–41)
Albumin: 3 g/dL — ABNORMAL LOW (ref 3.5–5.0)
Alkaline Phosphatase: 77 U/L (ref 38–126)
Anion gap: 8 (ref 5–15)
BUN: 9 mg/dL (ref 6–20)
CO2: 21 mmol/L — ABNORMAL LOW (ref 22–32)
Calcium: 9.2 mg/dL (ref 8.9–10.3)
Chloride: 106 mmol/L (ref 98–111)
Creatinine, Ser: 0.6 mg/dL (ref 0.44–1.00)
GFR, Estimated: >60 mL/min (ref >60)
Glucose, Bld: 79 mg/dL (ref 70–99)
Potassium: 3.8 mmol/L (ref 3.5–5.1)
Sodium: 135 mmol/L (ref 135–145)
Total Bilirubin: 0.5 mg/dL (ref 0.3–1.2)
Total Protein: 8 g/dL (ref 6.5–8.1)

## 2021-12-24 MED ORDER — LACTATED RINGERS IV SOLN: INTRAVENOUS | Status: DC

## 2021-12-24 MED ORDER — LIDOCAINE HCL (PF) 1 % IJ SOLN
INTRAMUSCULAR | Status: DC | PRN
  Administered 2021-12-24 (×2): 1 mL via SUBCUTANEOUS

## 2021-12-24 MED ORDER — FENTANYL CITRATE (PF) 100 MCG/2ML IJ SOLN
50.0000 ug | INTRAMUSCULAR | Status: DC | PRN
  Administered 2021-12-24: 100 ug via INTRAVENOUS
  Filled 2021-12-24: qty 2

## 2021-12-24 MED ORDER — LIDOCAINE HCL (PF) 1 % IJ SOLN: 30.0000 mL | INTRAMUSCULAR | Status: DC | PRN

## 2021-12-24 MED ORDER — ONDANSETRON HCL 4 MG/2ML IJ SOLN: 4.0000 mg | Freq: Four times a day (QID) | INTRAMUSCULAR | Status: DC | PRN

## 2021-12-24 MED ORDER — EPHEDRINE 5 MG/ML INJ: 10.0000 mg | INTRAVENOUS | Status: DC | PRN

## 2021-12-24 MED ORDER — MISOPROSTOL 25 MCG QUARTER TABLET
25.0000 ug | ORAL_TABLET | ORAL | Status: DC | PRN
  Administered 2021-12-24: 25 ug via VAGINAL
  Filled 2021-12-24: qty 1

## 2021-12-24 MED ORDER — PHENYLEPHRINE 80 MCG/ML (10ML) SYRINGE FOR IV PUSH (FOR BLOOD PRESSURE SUPPORT): 80.0000 ug | PREFILLED_SYRINGE | INTRAVENOUS | Status: DC | PRN

## 2021-12-24 MED ORDER — FENTANYL-BUPIVACAINE-NACL 0.5-0.125-0.9 MG/250ML-% EP SOLN
EPIDURAL | Status: AC
  Filled 2021-12-24: qty 250

## 2021-12-24 MED ORDER — TERBUTALINE SULFATE 1 MG/ML IJ SOLN
0.2500 mg | Freq: Once | INTRAMUSCULAR | Status: AC | PRN
  Administered 2021-12-25: 0.25 mg via SUBCUTANEOUS
  Filled 2021-12-24: qty 1

## 2021-12-24 MED ORDER — MISOPROSTOL 25 MCG QUARTER TABLET
50.0000 ug | ORAL_TABLET | ORAL | Status: DC
  Administered 2021-12-24 (×2): 50 ug via ORAL
  Filled 2021-12-24: qty 1

## 2021-12-24 MED ORDER — OXYTOCIN-SODIUM CHLORIDE 30-0.9 UT/500ML-% IV SOLN
1.0000 m[IU]/min | INTRAVENOUS | Status: DC
  Administered 2021-12-24: 2 m[IU]/min via INTRAVENOUS
  Filled 2021-12-24: qty 500

## 2021-12-24 MED ORDER — OXYTOCIN BOLUS FROM INFUSION: 333.0000 mL | Freq: Once | INTRAVENOUS | Status: DC

## 2021-12-24 MED ORDER — SODIUM CHLORIDE 0.9 % IV SOLN
INTRAVENOUS | Status: DC | PRN
  Administered 2021-12-24 (×2): 5 mL via EPIDURAL

## 2021-12-24 MED ORDER — LACTATED RINGERS IV SOLN
500.0000 mL | Freq: Once | INTRAVENOUS | Status: AC
  Administered 2021-12-24: 500 mL via INTRAVENOUS

## 2021-12-24 MED ORDER — AMMONIA AROMATIC IN INHA
RESPIRATORY_TRACT | Status: AC
  Filled 2021-12-24: qty 10

## 2021-12-24 MED ORDER — MISOPROSTOL 50MCG HALF TABLET
ORAL_TABLET | ORAL | Status: AC
  Filled 2021-12-24: qty 1

## 2021-12-24 MED ORDER — OXYTOCIN-SODIUM CHLORIDE 30-0.9 UT/500ML-% IV SOLN
2.5000 [IU]/h | INTRAVENOUS | Status: DC
  Filled 2021-12-24: qty 500

## 2021-12-24 MED ORDER — LIDOCAINE HCL (PF) 1 % IJ SOLN
INTRAMUSCULAR | Status: AC
  Filled 2021-12-24: qty 30

## 2021-12-24 MED ORDER — ACETAMINOPHEN 325 MG PO TABS
650.0000 mg | ORAL_TABLET | ORAL | Status: DC | PRN
  Administered 2021-12-24: 650 mg via ORAL
  Filled 2021-12-24: qty 2

## 2021-12-24 MED ORDER — MISOPROSTOL 200 MCG PO TABS
ORAL_TABLET | ORAL | Status: AC
  Administered 2021-12-24: 25 ug via VAGINAL
  Filled 2021-12-24: qty 4

## 2021-12-24 MED ORDER — FENTANYL-BUPIVACAINE-NACL 0.5-0.125-0.9 MG/250ML-% EP SOLN
12.0000 mL/h | EPIDURAL | Status: DC | PRN
  Administered 2021-12-24: 12 mL/h via EPIDURAL

## 2021-12-24 MED ORDER — MISOPROSTOL 25 MCG QUARTER TABLET
ORAL_TABLET | ORAL | Status: AC
  Filled 2021-12-24: qty 1

## 2021-12-24 MED ORDER — DIPHENHYDRAMINE HCL 50 MG/ML IJ SOLN: 12.5000 mg | INTRAMUSCULAR | Status: DC | PRN

## 2021-12-24 MED ORDER — LIDOCAINE-EPINEPHRINE (PF) 1.5 %-1:200000 IJ SOLN
INTRAMUSCULAR | Status: DC | PRN
  Administered 2021-12-24: 3 mL via EPIDURAL

## 2021-12-24 MED ORDER — LACTATED RINGERS IV SOLN
500.0000 mL | INTRAVENOUS | Status: DC | PRN
  Administered 2021-12-24: 500 mL via INTRAVENOUS

## 2021-12-24 MED ORDER — OXYTOCIN 10 UNIT/ML IJ SOLN
INTRAMUSCULAR | Status: AC
  Filled 2021-12-24: qty 2

## 2021-12-24 MED ORDER — SOD CITRATE-CITRIC ACID 500-334 MG/5ML PO SOLN: 30.0000 mL | ORAL | Status: DC | PRN

## 2021-12-24 NOTE — Progress Notes (Signed)
Labor Progress Note  DARENE NAPPI is a 21 y.o. G1P0 at [redacted]w[redacted]d by ultrasound admitted for induction of labor due to Hypertension and IUGR  Subjective: per bedside RN patient agitated and requesting pain medicine.   Objective: BP 139/86 (BP Location: Right Wrist)   Pulse (!) 101   Temp 98.1 F (36.7 C) (Oral)   Resp 16   Ht 5\' 1"  (1.549 m)   Wt 124.7 kg   LMP 05/13/2021 (Approximate) Comment: Patient awaiting her first prenatal appt.  BMI 51.96 kg/m  Notable VS details:   Fetal Assessment: FHT:  FHR: 130 bpm, variability: moderate,  accelerations:  Present,  decelerations:  Absent Category/reactivity:  Category I UC:   regular, every 1-4 minutes SVE: 3.5/50/-2 done by RN   Membrane status: SROM @ 2218 Amniotic color: Clear  Labs: Lab Results  Component Value Date   WBC 5.4 12/24/2021   HGB 11.0 (L) 12/24/2021   HCT 35.7 (L) 12/24/2021   MCV 71.8 (L) 12/24/2021   PLT 262 12/24/2021    Assessment / Plan: -IOL progressing normally on Cytotec -start low dose Pitocin to augment labor -IV pain medication given per patient request  Labor: Progressing normally Preeclampsia:  labs stable Fetal Wellbeing:  Category I Pain Control:  Epidural and IV pain meds I/D:   SROM x 20 mins, GBS neg Anticipated MOD:  NSVD  Shahrzad Koble [name] Colon Rueth, CNM 12/24/2021, 10:38 PM Evaluation remote, reviewed FHT

## 2021-12-24 NOTE — Progress Notes (Signed)
Labor Progress Note  Denise [name] is a 21 y.o. G1P0 at [redacted]w[redacted]d by ultrasound admitted for induction of labor due to Hypertension, Poor fetal growth.  Subjective: rating her contractions as period cramps 3/10 on pain scale  Objective: BP (!) 147/81 (BP Location: Right Arm)   Pulse 86   Temp 99.1 F (37.3 C) (Oral)   Resp 18   Ht 5\' 1"  (1.549 m)   Wt 124.7 kg   LMP 05/13/2021 (Approximate) Comment: Patient awaiting her first prenatal appt.  BMI 51.96 kg/m  Notable VS details:   Fetal Assessment: FHT:  FHR: 145 bpm, variability: moderate,  accelerations:  Present,  decelerations:  Absent Category/reactivity:  Category I UC:   irregular, every 1.5-4 minutes SVE:   closed Membrane status: intact Amniotic color: clear  Labs: Lab Results  Component Value Date   WBC 5.4 12/24/2021   HGB 11.0 (L) 12/24/2021   HCT 35.7 (L) 12/24/2021   MCV 71.8 (L) 12/24/2021   PLT 262 12/24/2021    Assessment / Plan: -Induction of labor for IUGR and HTN -Cytotec x2 -continuous FM -BP remain mild range,none treatable  Labor: Progressing normally Preeclampsia:  labs stable Fetal Wellbeing:  Category I Pain Control:  Labor support without medications I/D:  n/a Anticipated MOD:  NSVD  Tyrel Lex Denise [name] Jeyden Coffelt, CNM 12/24/2021, 6:21 PM

## 2021-12-24 NOTE — Anesthesia Procedure Notes (Signed)
Epidural Patient location during procedure: OB Start time: 12/24/2021 10:46 PM End time: 12/24/2021 10:58 PM  Staffing Anesthesiologist: Mayling Aber, Cleda Mccreedy, MD Performed: anesthesiologist   Preanesthetic Checklist Completed: patient identified, IV checked, site marked, risks and benefits discussed, surgical consent, monitors and equipment checked, pre-op evaluation and timeout performed  Epidural Patient position: sitting Prep: ChloraPrep Patient monitoring: heart rate, continuous pulse ox and blood pressure Approach: midline Location: L3-L4 Injection technique: LOR saline  Needle:  Needle type: Tuohy  Needle gauge: 17 G Needle length: 9 cm and 9 Needle insertion depth: 8 cm Catheter type: closed end flexible Catheter size: 19 Gauge Catheter at skin depth: 14 cm Test dose: negative and 1.5% lidocaine with Epi 1:200 K  Assessment Sensory level: T10 Events: injection not painful, no injection resistance, no paresthesia and negative IV test  Additional Notes 2 attempts Pt. Evaluated and documentation done after procedure finished. Patient identified. Risks/Benefits/Options discussed with patient including but not limited to bleeding, infection, nerve damage, paralysis, failed block, incomplete pain control, headache, blood pressure changes, nausea, vomiting, reactions to medication both or allergic, itching and postpartum back pain. Confirmed with bedside nurse the patient's most recent platelet count. Confirmed with patient that they are not currently taking any anticoagulation, have any bleeding history or any family history of bleeding disorders. Patient expressed understanding and wished to proceed. All questions were answered. Sterile technique was used throughout the entire procedure. Please see nursing notes for vital signs. Test dose was given through epidural catheter and negative prior to continuing to dose epidural or start infusion. Warning signs of high block given to the  patient including shortness of breath, tingling/numbness in hands, complete motor block, or any concerning symptoms with instructions to call for help. Patient was given instructions on fall risk and not to get out of bed. All questions and concerns addressed with instructions to call with any issues or inadequate analgesia.    Patient tolerated the insertion well without immediate complications.Reason for block:procedure for pain

## 2021-12-24 NOTE — Progress Notes (Signed)
MFM Note  Denise [name] was seen for a follow up growth scan and BPP due to maternal obesity and chronic hypertension.  She denies any problems since her last exam.  The patient's blood pressures were elevated today 140/73 and 151/102.  The overall EFW obtained today was 5 pounds 15 ounces which is at the 9th percentile for her gestational age, indicating fetal growth restriction.  There was normal amniotic fluid noted today.  Doppler studies of the umbilical arteries performed today showed a normal S/D ratio of 2.9.  There were no signs of absent or systolic flow noted.   A BPP performed today was 8 out of 8.  Due to IUGR and her elevated blood pressures, at her current gestational age of [redacted] weeks and 1 day , delivery is recommended.  The patient was brought to labor and delivery following today's ultrasound exam for induction.    The patient is agreeable and happy for delivery today.  A total of 20 minutes was spent counseling and coordinating the care for this patient.  Greater than 50% of the time was spent in direct face-to-face contact.

## 2021-12-24 NOTE — H&P (Signed)
OB History & Physical   History of Present Illness:   Chief Complaint: IOL IUGR and HTN  HPI:  MEEGAN LATTNER is a 21 y.o. G1P0 female at [redacted]w[redacted]d dated by [place].  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   ADHD (attention deficit hyperactivity disorder), 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     Maternal Medical History:   Past Medical History:  Diagnosis Date   ADHD    Anemia affecting pregnancy in third trimester    Premature baby    a.) patient born at 60 weeks   Premature puberty    a.) maternal menarche was at 21 years of age   PTSD (post-traumatic stress disorder)    Tachycardia     Past Surgical History:  Procedure Laterality Date   WISDOM TOOTH EXTRACTION Bilateral     Allergies  Allergen Reactions   Peanut-Containing Drug Products Swelling    Documented in [facility] ED record    Prior to Admission medications   Medication Sig Start Date End Date Taking? Authorizing Provider  aspirin 81 MG chewable tablet Chew 81 mg by mouth daily. Patient not taking: Reported on 12/24/2021    [provider]  ferrous sulfate 325 (65 FE) MG tablet Take 325 mg by mouth daily with breakfast. Patient not taking: Reported on 12/24/2021    [provider]  Prenatal Vit-DSS-Fe Fum-FA (PRENATAL 19) tablet Take 1 tablet by mouth daily. Patient not taking: Reported on 12/24/2021 04/28/21   Rada Hay, MD     Prenatal care site:  [facility]  OB History  Gravida Para Term Preterm AB Living  1 0 0 0 0 0  SAB IAB Ectopic Multiple Live Births  0 0 0 0 0    # Outcome Date GA Lbr Len/2nd Weight Sex Delivery Anes PTL Lv  1 Current              Social History: She  reports that she has never smoked. She has never used smokeless tobacco. She reports that she does not currently use alcohol after a past usage of about 2.0 standard drinks of alcohol per week. She reports that she does not use drugs.  Family History: family history includes Bipolar disorder in her mother; Enuresis in her sister.   Review of Systems: A full review of systems was performed and negative except as noted in the HPI.     Physical Exam:  Vital Signs: LMP 05/13/2021 (Approximate) Comment: Patient awaiting her first prenatal appt. Physical Exam Constitutional:      Appearance: Normal appearance. She is obese.  HENT:     Head: Normocephalic.  Cardiovascular:     Rate and Rhythm: Normal rate and regular rhythm.     Pulses: Normal pulses.     Heart sounds: Normal heart sounds.  Pulmonary:     Effort: Pulmonary effort is normal.     Breath sounds: Normal breath sounds.  Abdominal:  Palpations: Abdomen is soft.     Comments: Gravid  Genitourinary:    General: Normal vulva.  Musculoskeletal:        General: Normal range of motion.     Cervical back: Normal range of motion and neck supple.  Skin:    General: Skin is warm and dry.  Neurological:     Mental Status: She is alert and oriented to person, place, and time.  Psychiatric:        Mood and Affect: Mood normal.        Behavior: Behavior normal.        Thought Content: Thought content normal.        Judgment: Judgment normal.     General: no acute distress.  HEENT: normocephalic, atraumatic Heart: regular rate & rhythm.  No murmurs/rubs/gallops Lungs: clear to auscultation bilaterally, normal respiratory  effort Abdomen: soft, gravid, non-tender;  EFW: 5 lbs Pelvic:   External: Normal external female genitalia  Cervix:   /   /      Extremities: non-tender, symmetric, no edema bilaterally.  DTRs: +2  Neurologic: Alert & oriented x 3.    Results for orders placed or performed during the hospital encounter of 12/24/21 (from the past 24 hour(s))  CBC     Status: Abnormal   Collection Time: 12/24/21 11:49 AM  Result Value Ref Range   WBC 5.4 4.0 - 10.5 K/uL   RBC 4.97 3.87 - 5.11 MIL/uL   Hemoglobin 11.0 (L) 12.0 - 15.0 g/dL   HCT 35.7 (L) 36.0 - 46.0 %   MCV 71.8 (L) 80.0 - 100.0 fL   MCH 22.1 (L) 26.0 - 34.0 pg   MCHC 30.8 30.0 - 36.0 g/dL   RDW 15.3 11.5 - 15.5 %   Platelets 262 150 - 400 K/uL   nRBC 0.0 0.0 - 0.2 %  Type and screen [place]     Status: None   Collection Time: 12/24/21 11:49 AM  Result Value Ref Range   ABO/RH(D) A POS    Antibody Screen NEG    Sample Expiration      12/27/2021,2359 Performed at [facility], [address] [place], [place] [address]   Comprehensive metabolic panel     Status: Abnormal   Collection Time: 12/24/21 11:49 AM  Result Value Ref Range   Sodium 135 135 - 145 mmol/L   Potassium 3.8 3.5 - 5.1 mmol/L   Chloride 106 98 - 111 mmol/L   CO2 21 (L) 22 - 32 mmol/L   Glucose, Bld 79 70 - 99 mg/dL   BUN 9 6 - 20 mg/dL   Creatinine, Ser 0.60 0.44 - 1.00 mg/dL   Calcium 9.2 8.9 - 10.3 mg/dL   Total Protein 8.0 6.5 - 8.1 g/dL   Albumin 3.0 (L) 3.5 - 5.0 g/dL   AST 18 15 - 41 U/L   ALT 14 0 - 44 U/L   Alkaline Phosphatase 77 38 - 126 U/L   Total Bilirubin 0.5 0.3 - 1.2 mg/dL   GFR, Estimated >60 >60 mL/min   Anion gap 8 5 - 15  Protein / creatinine ratio, urine     Status: None   Collection Time: 12/24/21 11:49 AM  Result Value Ref Range   Creatinine, Urine 159 mg/dL   Total Protein, Urine 19 mg/dL   Protein Creatinine Ratio 0.12 0.00 - 0.15 mg/mg[Cre]    Pertinent Results:  Prenatal Labs: Blood  type/Rh A Pos  Antibody screen neg  Rubella Immune  Varicella Immune  RPR NR  HBsAg Neg  Hep C NR   HIV NR  GC neg  Chlamydia neg  Genetic screening cfDNA negative   1 hour GTT 163  3 hour GTT [phone]  GBS neg   FHT:  FHR: 145 bpm, variability: moderate,  accelerations:  Present,  decelerations:  Absent Category/reactivity:  Category I UC:   irregular, every 12 minutes   Cephalic by [place] and SVE   [place] MFM FETAL BPP WO NON STRESS  Result Date: 12/24/2021 ----------------------------------------------------------------------  OBSTETRICS REPORT                       (Signed Final 12/24/2021 11:19 am) ---------------------------------------------------------------------- Patient Info  ID #:       [id number]                          D.O.B.:  06-10-2001 (20 yrs)  Name:       SHAKTI KETCHAM [facility]              Visit Date: 12/24/2021 10:39 am ---------------------------------------------------------------------- Performed By  Attending:        Johnell Comings MD         Referred By:      Gertie Fey  Performed By:     Eveline Keto         [facility]                                     Fetal Care at                                                             [facility] ---------------------------------------------------------------------- Orders  #  Description                           Code        Ordered By  1  [place] MFM FETAL BPP WO NON               76819.01    YU FANG     STRESS  2  [place] MFM OB FOLLOW UP                   [id number]    YU FANG ----------------------------------------------------------------------  #  Order #                     Accession #                Episode #  1  [id number]  [id number]                 [id number]  2  [id number]                   [id number]                 [id number]  ---------------------------------------------------------------------- Indications  Maternal care for known or suspected poor      O36.5931  fetal growth, third trimester, fetus 1 IUGR  Obesity complicating pregnancy, third          O99.213  trimester (BMI:>50%)  [redacted] weeks gestation of pregnancy                Z3A.38  Hypertension - Chronic/Pre-existing (ASA)      O10.019  Abnormal finding on antenatal screening        O28.9  (HbA1C:6 )  MaT21:Low risk, XX, FF:4% ---------------------------------------------------------------------- Vital Signs  BP:          140/73 ---------------------------------------------------------------------- Fetal Evaluation  Num Of Fetuses:         1  Fetal Heart Rate(bpm):  140  Cardiac Activity:       Observed  Presentation:           Cephalic  Placenta:               Posterior  P. Cord Insertion:      Previously Visualized  Amniotic Fluid  AFI FV:      Within normal limits  AFI Sum(cm)     %Tile       Largest Pocket(cm)  9.55            22          4.15  RUQ(cm)       RLQ(cm)       LUQ(cm)        LLQ(cm)  0             3.12          4.15           2.28 ---------------------------------------------------------------------- Biophysical Evaluation  Amniotic F.V:   Within normal limits       F. Tone:        Observed  F. Movement:    Observed                   Score:          8/8  F. Breathing:   Observed ---------------------------------------------------------------------- Biometry  BPD:     88.73  mm     G. Age:  35w 6d         16  %    CI:        77.67   %    70 - 86                                                          FL/HC:      22.2   %    20.9 - 22.7  HC:    318.66   mm     G. Age:  35w 6d        2.2  %    HC/AC:      1.03        0.92 - 1.05  AC:    310.81   mm     G. Age:  35w 0d          3  %    FL/BPD:     79.6   %    71 - 87  FL:      70.66  mm     G. Age:  36w 2d         11  %    FL/AC:      22.7   %    20 - 24  Est. FW:    2700  gm    5 lb 15 oz       9  %  ---------------------------------------------------------------------- OB History  Gravidity:    1 ---------------------------------------------------------------------- Gestational Age  LMP:           38w 1d        Date:  04/01/21                 EDD:   01/06/22  U/S Today:     35w 5d                                        EDD:   01/23/22  Best:          38w 1d     Det. By:  LMP  (04/01/21)          EDD:   01/06/22 ---------------------------------------------------------------------- Anatomy  Cranium:               Appears normal         Stomach:                Appears normal, left                                                                        sided  Heart:                 Appears normal         Kidneys:                Appear normal                         (4CH, axis, and                         situs)  Diaphragm:             Appears normal         Bladder:                Appears normal ---------------------------------------------------------------------- Doppler - Fetal Vessels  Umbilical Artery   S/D     %tile      RI    %tile      PI    %tile     PSV    ADFV    RDFV                                                     (  cm/s)    2.9       84    0.65       87    1.09       93    46.26      No      No ---------------------------------------------------------------------- Cervix Uterus Adnexa  Cervix  Not visualized (advanced GA >24wks)  Uterus  No abnormality visualized.  Right Ovary  Not visualized.  Left Ovary  Not visualized. ---------------------------------------------------------------------- Comments  Brenton Grills was seen for a follow up growth scan and  BPP due to maternal obesity and chronic hypertension.  She  denies any problems since her last exam.  The patient's blood pressures were elevated today 140/73  and 151/102.  The overall EFW obtained today was 5 pounds 15 ounces  which is at the 9th percentile for her gestational age,  indicating fetal growth restriction.  There was normal amniotic   fluid noted today.  Doppler studies of the umbilical arteries performed today  showed a normal S/D ratio of 2.9.  There were no signs of  absent or systolic flow noted.  A BPP performed today was 8 out of 8.  Due to IUGR and her elevated blood pressures, at her  current gestational age of 101 weeks and 1 day , delivery is  recommended.  The patient was brought to labor and delivery following  today's ultrasound exam for induction.  The patient is agreeable and happy for delivery today.  A total of 20 minutes was spent counseling and coordinating  the care for this patient.  Greater than 50% of the time was  spent in direct face-to-face contact. ----------------------------------------------------------------------                  Johnell Comings, MD Electronically Signed Final Report   12/24/2021 11:19 am ----------------------------------------------------------------------  [place] MFM OB FOLLOW UP  Result Date: 12/24/2021 ----------------------------------------------------------------------  OBSTETRICS REPORT                       (Signed Final 12/24/2021 11:19 am) ---------------------------------------------------------------------- Patient Info  ID #:       [id number]                          D.O.B.:  [birth year] (20 yrs)  Name:       GIZEL FILLER [facility]              Visit Date: 12/24/2021 10:39 am ---------------------------------------------------------------------- Performed By  Attending:        Johnell Comings MD         Referred By:      Gertie Fey  Performed By:     Eveline Keto         [facility]                    RDMS  [facility] at                                                             [facility] ---------------------------------------------------------------------- Orders  #  Description                           Code        Ordered By  1  [place] MFM FETAL BPP WO NON                76819.01    YU FANG     STRESS  2  [place] MFM OB FOLLOW UP                   76816.01    YU FANG ----------------------------------------------------------------------  #  Order #                     Accession #                Episode #  1  [id number]                   [id number]                 [id number]  2  [id number]                   [id number]                 [id number] ---------------------------------------------------------------------- Indications  Maternal care for known or suspected poor      O36.5931  fetal growth, third trimester, fetus 1 IUGR  Obesity complicating pregnancy, third          O99.213  trimester (BMI:>50%)  [redacted] weeks gestation of pregnancy                Z3A.38  Hypertension - Chronic/Pre-existing (ASA)      O10.019  Abnormal finding on antenatal screening        O28.9  (HbA1C:6 )  MaT21:Low risk, XX, FF:4% ---------------------------------------------------------------------- Vital Signs  BP:          140/73 ---------------------------------------------------------------------- Fetal Evaluation  Num Of Fetuses:         1  Fetal Heart Rate(bpm):  140  Cardiac Activity:       Observed  Presentation:           Cephalic  Placenta:               Posterior  P. Cord Insertion:      Previously Visualized  Amniotic Fluid  AFI FV:      Within normal limits  AFI Sum(cm)     %Tile       Largest Pocket(cm)  9.55            22          4.15  RUQ(cm)       RLQ(cm)       LUQ(cm)        LLQ(cm)  0             3.12          4.15           2.28 ---------------------------------------------------------------------- Biophysical  Evaluation  Amniotic F.V:   Within normal limits       F. Tone:        Observed  F. Movement:    Observed                   Score:          8/8  F. Breathing:   Observed ---------------------------------------------------------------------- Biometry  BPD:     88.73  mm     G. Age:  35w 6d         16  %    CI:        77.67   %    70 - 86                                                           FL/HC:      22.2   %    20.9 - 22.7  HC:    318.66   mm     G. Age:  35w 6d        2.2  %    HC/AC:      1.03        0.92 - 1.05  AC:    310.81   mm     G. Age:  35w 0d          3  %    FL/BPD:     79.6   %    71 - 87  FL:      70.66  mm     G. Age:  36w 2d         11  %    FL/AC:      22.7   %    20 - 24  Est. FW:    2700  gm    5 lb 15 oz       9  % ---------------------------------------------------------------------- OB History  Gravidity:    1 ---------------------------------------------------------------------- Gestational Age  LMP:           38w 1d        Date:  04/01/21                 EDD:   01/06/22  U/S Today:     35w 5d                                        EDD:   01/23/22  Best:          38w 1d     Det. By:  LMP  (04/01/21)          EDD:   01/06/22 ---------------------------------------------------------------------- Anatomy  Cranium:               Appears normal         Stomach:                Appears normal, left  sided  Heart:                 Appears normal         Kidneys:                Appear normal                         (4CH, axis, and                         situs)  Diaphragm:             Appears normal         Bladder:                Appears normal ---------------------------------------------------------------------- Doppler - Fetal Vessels  Umbilical Artery   S/D     %tile      RI    %tile      PI    %tile     PSV    ADFV    RDFV                                                     (cm/s)    2.9       84    0.65       87    1.09       93    46.26      No      No ---------------------------------------------------------------------- Cervix Uterus Adnexa  Cervix  Not visualized (advanced GA >24wks)  Uterus  No abnormality visualized.  Right Ovary  Not visualized.  Left Ovary  Not visualized. ---------------------------------------------------------------------- Comments  [name] was seen for a follow up growth  scan and  BPP due to maternal obesity and chronic hypertension.  She  denies any problems since her last exam.  The patient's blood pressures were elevated today 140/73  and 151/102.  The overall EFW obtained today was 5 pounds 15 ounces  which is at the 9th percentile for her gestational age,  indicating fetal growth restriction.  There was normal amniotic  fluid noted today.  Doppler studies of the umbilical arteries performed today  showed a normal S/D ratio of 2.9.  There were no signs of  absent or systolic flow noted.  A BPP performed today was 8 out of 8.  Due to IUGR and her elevated blood pressures, at her  current gestational age of [redacted] weeks and 1 day , delivery is  recommended.  The patient was brought to labor and delivery following  today's ultrasound exam for induction.  The patient is agreeable and happy for delivery today.  A total of 20 minutes was spent counseling and coordinating  the care for this patient.  Greater than 50% of the time was  spent in direct face-to-face contact. ----------------------------------------------------------------------                  Ma Rings, MD Electronically Signed Final Report   12/24/2021 11:19 am ----------------------------------------------------------------------  [place] MFM FETAL BPP WO NON STRESS  Result Date: 12/12/2021 ----------------------------------------------------------------------  OBSTETRICS REPORT                        (Signed Final 12/12/2021 07:10  pm) ---------------------------------------------------------------------- Patient Info  ID #:       [id number]                          D.O.B.:  2000/08/01 (20 yrs)  Name:       CALAYA SCHAB [facility]              Visit Date: 12/10/2021 11:03 am ---------------------------------------------------------------------- Performed By  Attending:        Lin Landsman      Referred By:       Marny Lowenstein                    MD                                        WILSON  Performed By:     Birdena Crandall         [facility]          [facility]                    RDMS,RVT                                  Fetal Care at                                                              [facility] ---------------------------------------------------------------------- Orders  #  Description                           Code        Ordered By  1  [place] MFM FETAL BPP WO NON               76819.01    CORENTHIAN     STRESS                                            BOOKER ----------------------------------------------------------------------  #  Order #                     Accession #                Episode #  1  [id number]                   [id number]                 [id number] ---------------------------------------------------------------------- Indications  Obesity complicating pregnancy, third           O99.213  trimester (BMI:>50%)  Hypertension - Chronic/Pre-existing (ASA)       O10.019  Abnormal finding on antenatal screening         O28.9  (HbA1C:6 )  MaT21:Low risk, XX, FF:4%  [redacted] weeks gestation of pregnancy                 Z3A.36 ---------------------------------------------------------------------- Fetal Evaluation  Num Of  Fetuses:          1  Fetal Heart Rate(bpm):   134  Cardiac Activity:        Observed  Presentation:            Cephalic  Placenta:                Posterior  Amniotic Fluid  AFI FV:      Within normal limits  AFI Sum(cm)     %Tile       Largest Pocket(cm)  9.48            18          4.37  RUQ(cm)       RLQ(cm)       LUQ(cm)        LLQ(cm)  4.37          2.07          1.26           1.78 ---------------------------------------------------------------------- Biophysical Evaluation  Amniotic F.V:   Within normal limits       F. Tone:         Observed  F. Movement:    Observed                   Score:           8/8  F. Breathing:   Observed ---------------------------------------------------------------------- OB History  Gravidity:    1  ---------------------------------------------------------------------- Gestational Age  LMP:           36w 1d        Date:  04/01/21                 EDD:   01/06/22  Best:          36w 1d     Det. By:  LMP  (04/01/21)          EDD:   01/06/22 ---------------------------------------------------------------------- Anatomy  Cranium:               Appears normal         Cord Vessels:           Appears normal (3                                                                        vessel cord)  Ventricles:            Appears normal         Kidneys:                Appear normal  Heart:                 Appears normal         Bladder:                Appears normal                         (4CH, axis, and                         situs)  Stomach:  Appears normal, left                         sided ---------------------------------------------------------------------- Impression  Antenatal testing performed given maternal chronic  hypertension  The biophysical profile was 8/8 with good fetal movement and  amniotic fluid volume. ---------------------------------------------------------------------- Recommendations  Continue weekly testing. ----------------------------------------------------------------------              Sander Nephew, MD Electronically Signed Final Report   12/12/2021 07:10 pm ----------------------------------------------------------------------  [place] MFM OB FOLLOW UP  Result Date: 11/26/2021 ----------------------------------------------------------------------  OBSTETRICS REPORT                       (Signed Final 11/26/2021 11:53 am) ---------------------------------------------------------------------- Patient Info  ID #:       [id number]                          D.O.B.:  09-Sep-2000 (20 yrs)  Name:       CHRISS JANISH [facility]              Visit Date: 11/26/2021 10:44 am ---------------------------------------------------------------------- Performed By  Attending:        Johnell Comings MD          Referred By:      Gertie Fey  Performed By:     Eveline Keto         [facility]         [facility]                    RDMS                                     F[facility] Care at                                                             [facility] ---------------------------------------------------------------------- Orders  #  Description                           Code        Ordered By  1  [place] MFM OB FOLLOW UP                   [id number]    Sander Nephew  2  [place] MFM FETAL BPP  01093.2     Michaelene Song ----------------------------------------------------------------------  #  Order #                     Accession #                Episode #  1  [id number]                   [id number]                 [id number]  2  [id number]                   [id number]                 [id number] ---------------------------------------------------------------------- Indications  Obesity complicating pregnancy, third          O99.213  trimester (BMI:>50%)  Hypertension - Chronic/Pre-existing (ASA)      O10.019  [redacted] weeks gestation of pregnancy                Z3A.34  Abnormal finding on antenatal screening        O28.9  (HbA1C:6 )  MaT21:Low risk, XX, FF:4% ---------------------------------------------------------------------- Vital Signs  BP:          125/67 ---------------------------------------------------------------------- Fetal Evaluation  Num Of Fetuses:         1  Fetal Heart Rate(bpm):  133  Cardiac Activity:       Observed  Presentation:           Cephalic  Placenta:               Posterior  P. Cord Insertion:      Previously Visualized  Amniotic Fluid  AFI FV:      Within normal limits  AFI Sum(cm)     %Tile       Largest Pocket(cm)  10.82           25          5.38  RUQ(cm)       RLQ(cm)       LUQ(cm)         LLQ(cm)  5.38          1.61          1.57           2.26 ---------------------------------------------------------------------- Biophysical Evaluation  Amniotic F.V:   Within normal limits       F. Tone:        Observed  F. Movement:    Observed                   N.S.T:          Reactive  F. Breathing:   Not Observed               Score:          8/10 ---------------------------------------------------------------------- Biometry  BPD:     81.72  mm     G. Age:  32w 6d         14  %    CI:        74.86   %    70 - 86  FL/HC:      20.9   %    19.4 - 21.8  HC:    299.69   mm     G. Age:  33w 2d        4.7  %    HC/AC:      1.04        0.96 - 1.11  AC:    288.69   mm     G. Age:  32w 6d         19  %    FL/BPD:     76.8   %    71 - 87  FL:      62.77  mm     G. Age:  32w 3d          8  %    FL/AC:      21.7   %    20 - 24  LV:        1.6  mm  Est. FW:    2052  gm      4 lb 8 oz     12  % ---------------------------------------------------------------------- OB History  Gravidity:    1 ---------------------------------------------------------------------- Gestational Age  LMP:           34w 1d        Date:  04/01/21                 EDD:   01/06/22  U/S Today:     32w 6d                                        EDD:   01/15/22  Best:          34w 1d     Det. By:  LMP  (04/01/21)          EDD:   01/06/22 ---------------------------------------------------------------------- Anatomy  Cranium:               Appears normal         Diaphragm:              Appears normal  Cavum:                 Appears normal         Stomach:                Appears normal, left                                                                        sided  Ventricles:            Appears normal         Abdomen:                Appears normal  Heart:                 Appears normal         Kidneys:                Appear normal                         (  4CH, axis, and                         situs)   RVOT:                  Appears normal         Bladder:                Appears normal  LVOT:                  Appears normal ---------------------------------------------------------------------- Cervix Uterus Adnexa  Cervix  Not visualized (advanced GA >24wks)  Uterus  No abnormality visualized.  Right Ovary  Not visualized.  Left Ovary  Not visualized. ---------------------------------------------------------------------- Comments  This patient was seen for a follow up growth scan due to  maternal obesity and chronic hypertension.  She denies any  problems since her last exam.   The overall EFW measured at the 12th percentile today.  There was normal amniotic fluid noted.  A BPP performed today was 8 out of 10 with a reactive NST.  She received a -2 for fetal breathing movements that did not  meet criteria.  She will return in 1 week for another BPP. ----------------------------------------------------------------------                  Johnell Comings, MD Electronically Signed Final Report   11/26/2021 11:53 am ----------------------------------------------------------------------  [place] MFM FETAL BPP W/NONSTRESS  Result Date: 11/26/2021 ----------------------------------------------------------------------  OBSTETRICS REPORT                       (Signed Final 11/26/2021 11:53 am) ---------------------------------------------------------------------- Patient Info  ID #:       [id number]                          D.O.B.:  [birth year] (20 yrs)  Name:       JOYLYN BEZIO [facility]              Visit Date: 11/26/2021 10:44 am ---------------------------------------------------------------------- Performed By  Attending:        Johnell Comings MD         Referred By:      Gertie Fey  Performed By:     Eveline Keto         [facility]  [place] ---------------------------------------------------------------------- Orders  #  Description                           Code        Ordered By  1  [place] MFM OB FOLLOW UP                   [id number]    Sander Nephew  2  [place] MFM FETAL BPP                      [id number]     [facility]     W/NONSTRESS                                       BOOKER ----------------------------------------------------------------------  #  Order #                     Accession #                Episode #  1  [id number]                   [id number]                 [id number]  2  [id number]                   [id number]                 [id number] ---------------------------------------------------------------------- Indications  Obesity complicating pregnancy, third          O99.213  trimester (BMI:>50%)  Hypertension - Chronic/Pre-existing (ASA)      O10.019  [redacted] weeks gestation of pregnancy                Z3A.34  Abnormal finding on antenatal screening        O28.9  (HbA1C:6 )  MaT21:Low risk, XX, FF:4% ---------------------------------------------------------------------- Vital Signs  BP:          125/67 ---------------------------------------------------------------------- Fetal Evaluation  Num Of Fetuses:         1  Fetal Heart Rate(bpm):  133  Cardiac Activity:       Observed  Presentation:           Cephalic  Placenta:               Posterior  P. Cord Insertion:      Previously Visualized  Amniotic Fluid  AFI FV:      Within normal limits  AFI Sum(cm)     %Tile       Largest Pocket(cm)  10.82           25          5.38  RUQ(cm)       RLQ(cm)       LUQ(cm)        LLQ(cm)  5.38          1.61          1.57  2.26 ---------------------------------------------------------------------- Biophysical Evaluation  Amniotic F.V:   Within normal limits       F. Tone:        Observed  F. Movement:    Observed                   N.S.T:          Reactive  F.  Breathing:   Not Observed               Score:          8/10 ---------------------------------------------------------------------- Biometry  BPD:     81.72  mm     G. Age:  32w 6d         14  %    CI:        74.86   %    70 - 86                                                          FL/HC:      20.9   %    19.4 - 21.8  HC:    299.69   mm     G. Age:  33w 2d        4.7  %    HC/AC:      1.04        0.96 - 1.11  AC:    288.69   mm     G. Age:  32w 6d         19  %    FL/BPD:     76.8   %    71 - 87  FL:      62.77  mm     G. Age:  32w 3d          8  %    FL/AC:      21.7   %    20 - 24  LV:        1.6  mm  Est. FW:    2052  gm      4 lb 8 oz     12  % ---------------------------------------------------------------------- OB History  Gravidity:    1 ---------------------------------------------------------------------- Gestational Age  LMP:           34w 1d        Date:  04/01/21                 EDD:   01/06/22  U/S Today:     32w 6d                                        EDD:   01/15/22  Best:          34w 1d     Det. By:  LMP  (04/01/21)          EDD:   01/06/22 ---------------------------------------------------------------------- Anatomy  Cranium:               Appears normal         Diaphragm:              Appears normal  Cavum:  Appears normal         Stomach:                Appears normal, left                                                                        sided  Ventricles:            Appears normal         Abdomen:                Appears normal  Heart:                 Appears normal         Kidneys:                Appear normal                         (4CH, axis, and                         situs)  RVOT:                  Appears normal         Bladder:                Appears normal  LVOT:                  Appears normal ---------------------------------------------------------------------- Cervix Uterus Adnexa  Cervix  Not visualized (advanced GA >24wks)  Uterus  No abnormality  visualized.  Right Ovary  Not visualized.  Left Ovary  Not visualized. ---------------------------------------------------------------------- Comments  This patient was seen for a follow up growth scan due to  maternal obesity and chronic hypertension.  She denies any  problems since her last exam.   The overall EFW measured at the 12th percentile today.  There was normal amniotic fluid noted.  A BPP performed today was 8 out of 10 with a reactive NST.  She received a -2 for fetal breathing movements that did not  meet criteria.  She will return in 1 week for another BPP. ----------------------------------------------------------------------                  Johnell Comings, MD Electronically Signed Final Report   11/26/2021 11:53 am ----------------------------------------------------------------------   Assessment:  Allyn Kenner is a 20 y.o. G1P0 female at [redacted]w[redacted]d with IUGR, HTN.   Plan:  1. Admit to Labor & Delivery - consents reviewed and obtained  2. Fetal Well being  - Fetal Tracing: Category 1 - Group B Streptococcus ppx not indicated: GBS neg - Presentation: cephalic confirmed by [place]   3. Routine OB: - Prenatal labs reviewed, as above - Rh pos - CBC, T&S, RPR on admit - Clear liquid diet , continuous IV fluids  4. Induction of labor  - Contractions monitored with external toco - Pelvis  adequate for trial of labor  - Plan for induction with misoprostol and oxytocin  - Induction with oxytocin, AROM, and cervical balloon as appropriate  - Plan for  continuous fetal monitoring - Maternal pain control as desired; planning  regional anesthesia, IVPM, nitrous oxide, and position changes  - Anticipate vaginal delivery  5. Post Partum Planning: - Infant feeding: TBD - Contraception: TBD - Tdap vaccine: - did not receive - Flu vaccine: did not receive  Demyan Fugate Marlaine Hind, CNM [id number] PM  Avelino Leeds, CNM Certified Nurse Midwife [facility]

## 2021-12-24 NOTE — Anesthesia Preprocedure Evaluation (Addendum)
Anesthesia Evaluation  Patient identified by MRN, date of birth, ID band Patient awake    Reviewed: Allergy & Precautions, NPO status , Patient's Chart, lab work & pertinent test results  History of Anesthesia Complications Negative for: history of anesthetic complications  Airway Mallampati: III  TM Distance: >3 FB Neck ROM: full    Dental  (+) Chipped   Pulmonary neg pulmonary ROS,    Pulmonary exam normal        Cardiovascular Exercise Tolerance: Good hypertension, Normal cardiovascular exam     Neuro/Psych    GI/Hepatic negative GI ROS,   Endo/Other    Renal/GU   negative genitourinary   Musculoskeletal   Abdominal   Peds  Hematology negative hematology ROS (+)   Anesthesia Other Findings Past Medical History: No date: ADHD No date: Anemia affecting pregnancy in third trimester No date: Premature baby     Comment:  a.) patient born at 2 weeks No date: Premature puberty     Comment:  a.) maternal menarche was at 21 years of age No date: PTSD (post-traumatic stress disorder) No date: Tachycardia  Past Surgical History: No date: WISDOM TOOTH EXTRACTION; Bilateral  BMI    Body Mass Index: 51.96 kg/m      Reproductive/Obstetrics (+) Pregnancy                             Anesthesia Physical Anesthesia Plan  ASA: 3  Anesthesia Plan: Epidural   Post-op Pain Management:    Induction:   PONV Risk Score and Plan:   Airway Management Planned: Natural Airway  Additional Equipment:   Intra-op Plan:   Post-operative Plan:   Informed Consent: I have reviewed the patients History and Physical, chart, labs and discussed the procedure including the risks, benefits and alternatives for the proposed anesthesia with the patient or authorized representative who has indicated his/her understanding and acceptance.     Dental Advisory Given  Plan Discussed with:  Anesthesiologist  Anesthesia Plan Comments: (Patient reports no bleeding problems and no anticoagulant use.   Patient consented for risks of anesthesia including but not limited to:  - adverse reactions to medications - risk of bleeding, infection and or nerve damage from epidural that could lead to paralysis - risk of headache or failed epidural - nerve damage due to positioning - that if epidural is used for C-section that there is a chance of epidural failure requiring spinal placement or conversion to GA - Damage to heart, brain, lungs, other parts of body or loss of life  Patient voiced understanding.)        Anesthesia Quick Evaluation

## 2021-12-25 ENCOUNTER — Encounter
Admission: RE | Disposition: A | Discharge status: Home / Self Care | Payer: Self-pay | Source: Home / Self Care | Attending: Obstetrics

## 2021-12-25 ENCOUNTER — Encounter: Payer: Self-pay | Admitting: Obstetrics and Gynecology

## 2021-12-25 LAB — CBC
HCT: 30.8 % — ABNORMAL LOW (ref 36.0–46.0)
Hemoglobin: 9.5 g/dL — ABNORMAL LOW (ref 12.0–15.0)
MCH: 22.2 pg — ABNORMAL LOW (ref 26.0–34.0)
MCHC: 30.8 g/dL (ref 30.0–36.0)
MCV: 72 fL — ABNORMAL LOW (ref 80.0–100.0)
Platelets: 218 10*3/uL (ref 150–400)
RBC: 4.28 MIL/uL (ref 3.87–5.11)
RDW: 15.1 % (ref 11.5–15.5)
WBC: 9.3 10*3/uL (ref 4.0–10.5)
nRBC: 0 % (ref 0.0–0.2)

## 2021-12-25 LAB — RPR: RPR Ser Ql: NONREACTIVE

## 2021-12-25 LAB — CREATININE, SERUM
Creatinine, Ser: 0.55 mg/dL (ref 0.44–1.00)
GFR, Estimated: >60 mL/min (ref >60)

## 2021-12-25 SURGERY — Surgical Case
Anesthesia: Epidural

## 2021-12-25 MED ORDER — DIPHENHYDRAMINE HCL 25 MG PO CAPS: 25.0000 mg | ORAL_CAPSULE | ORAL | Status: DC | PRN

## 2021-12-25 MED ORDER — METHYLERGONOVINE MALEATE 0.2 MG/ML IJ SOLN
INTRAMUSCULAR | Status: AC
  Filled 2021-12-25: qty 1

## 2021-12-25 MED ORDER — IBUPROFEN 600 MG PO TABS
600.0000 mg | ORAL_TABLET | Freq: Four times a day (QID) | ORAL | Status: DC
Start: 2021-12-26 — End: 2021-12-26
  Administered 2021-12-26 (×2): 600 mg via ORAL
  Filled 2021-12-25 (×3): qty 1

## 2021-12-25 MED ORDER — DIPHENHYDRAMINE HCL 50 MG/ML IJ SOLN: 12.5000 mg | INTRAMUSCULAR | Status: DC | PRN

## 2021-12-25 MED ORDER — MORPHINE SULFATE (PF) 2 MG/ML IV SOLN: 1.0000 mg | INTRAVENOUS | Status: DC | PRN

## 2021-12-25 MED ORDER — ENOXAPARIN SODIUM 60 MG/0.6ML IJ SOSY
0.5000 mg/kg | PREFILLED_SYRINGE | INTRAMUSCULAR | Status: DC
  Filled 2021-12-25 (×2): qty 0.63

## 2021-12-25 MED ORDER — OXYCODONE HCL 5 MG PO TABS: 5.0000 mg | ORAL_TABLET | ORAL | Status: DC | PRN

## 2021-12-25 MED ORDER — KETOROLAC TROMETHAMINE 30 MG/ML IJ SOLN
30.0000 mg | Freq: Four times a day (QID) | INTRAMUSCULAR | Status: AC
  Administered 2021-12-25 – 2021-12-26 (×4): 30 mg via INTRAVENOUS
  Filled 2021-12-25 (×4): qty 1

## 2021-12-25 MED ORDER — TETANUS-DIPHTH-ACELL PERTUSSIS 5-2.5-18.5 LF-MCG/0.5 IM SUSY: 0.5000 mL | PREFILLED_SYRINGE | Freq: Once | INTRAMUSCULAR | Status: DC

## 2021-12-25 MED ORDER — LACTATED RINGERS IV SOLN: INTRAVENOUS | Status: DC

## 2021-12-25 MED ORDER — FENTANYL CITRATE (PF) 100 MCG/2ML IJ SOLN
INTRAMUSCULAR | Status: AC
  Filled 2021-12-25: qty 2

## 2021-12-25 MED ORDER — ZOLPIDEM TARTRATE 5 MG PO TABS: 5.0000 mg | ORAL_TABLET | Freq: Every evening | ORAL | Status: DC | PRN

## 2021-12-25 MED ORDER — SODIUM CHLORIDE (PF) 0.9 % IJ SOLN
INTRAMUSCULAR | Status: AC
  Filled 2021-12-25: qty 50

## 2021-12-25 MED ORDER — WITCH HAZEL-GLYCERIN EX PADS: 1.0000 | MEDICATED_PAD | CUTANEOUS | Status: DC | PRN

## 2021-12-25 MED ORDER — COCONUT OIL OIL: 1.0000 | TOPICAL_OIL | Status: DC | PRN

## 2021-12-25 MED ORDER — ONDANSETRON HCL 4 MG/2ML IJ SOLN
INTRAMUSCULAR | Status: AC
  Filled 2021-12-25: qty 2

## 2021-12-25 MED ORDER — SODIUM CHLORIDE 0.9 % IV SOLN
500.0000 mg | INTRAVENOUS | Status: DC
  Administered 2021-12-25: 500 mg via INTRAVENOUS
  Filled 2021-12-25: qty 5

## 2021-12-25 MED ORDER — MENTHOL 3 MG MT LOZG: 1.0000 | LOZENGE | OROMUCOSAL | Status: DC | PRN

## 2021-12-25 MED ORDER — ONDANSETRON HCL 4 MG/2ML IJ SOLN: 4.0000 mg | Freq: Three times a day (TID) | INTRAMUSCULAR | Status: DC | PRN

## 2021-12-25 MED ORDER — SODIUM CHLORIDE 0.9 % IV SOLN
INTRAVENOUS | Status: AC | PRN
  Administered 2021-12-25: 50 mL via INTRAMUSCULAR

## 2021-12-25 MED ORDER — SENNOSIDES-DOCUSATE SODIUM 8.6-50 MG PO TABS
2.0000 | ORAL_TABLET | Freq: Every day | ORAL | Status: DC
Start: 2021-12-26 — End: 2021-12-26
  Administered 2021-12-26: 2 via ORAL
  Filled 2021-12-25: qty 2

## 2021-12-25 MED ORDER — SOD CITRATE-CITRIC ACID 500-334 MG/5ML PO SOLN
ORAL | Status: AC
  Filled 2021-12-25: qty 15

## 2021-12-25 MED ORDER — TRANEXAMIC ACID-NACL 1000-0.7 MG/100ML-% IV SOLN
INTRAVENOUS | Status: AC
  Filled 2021-12-25: qty 100

## 2021-12-25 MED ORDER — ONDANSETRON HCL 4 MG/2ML IJ SOLN
INTRAMUSCULAR | Status: DC | PRN
  Administered 2021-12-25: 4 mg via INTRAVENOUS

## 2021-12-25 MED ORDER — ACETAMINOPHEN 500 MG PO TABS
1000.0000 mg | ORAL_TABLET | Freq: Four times a day (QID) | ORAL | Status: DC
  Administered 2021-12-25 – 2021-12-26 (×5): 1000 mg via ORAL
  Filled 2021-12-25 (×7): qty 2

## 2021-12-25 MED ORDER — KETOROLAC TROMETHAMINE 30 MG/ML IJ SOLN: 30.0000 mg | Freq: Four times a day (QID) | INTRAMUSCULAR | Status: DC

## 2021-12-25 MED ORDER — BUPIVACAINE HCL (PF) 0.5 % IJ SOLN
INTRAMUSCULAR | Status: DC | PRN
  Administered 2021-12-25: 60 mL

## 2021-12-25 MED ORDER — FENTANYL CITRATE (PF) 100 MCG/2ML IJ SOLN
INTRAMUSCULAR | Status: DC | PRN
Start: 2021-12-25 — End: 2021-12-25
  Administered 2021-12-25: 100 ug via EPIDURAL

## 2021-12-25 MED ORDER — LIDOCAINE HCL (PF) 2 % IJ SOLN
INTRAMUSCULAR | Status: DC | PRN
  Administered 2021-12-25 (×5): 100 mg via EPIDURAL

## 2021-12-25 MED ORDER — FENTANYL CITRATE (PF) 100 MCG/2ML IJ SOLN: 25.0000 ug | INTRAMUSCULAR | Status: DC | PRN

## 2021-12-25 MED ORDER — SIMETHICONE 80 MG PO CHEW
80.0000 mg | CHEWABLE_TABLET | Freq: Three times a day (TID) | ORAL | Status: DC
  Administered 2021-12-25 – 2021-12-26 (×5): 80 mg via ORAL
  Filled 2021-12-25 (×6): qty 1

## 2021-12-25 MED ORDER — DIPHENHYDRAMINE HCL 25 MG PO CAPS: 25.0000 mg | ORAL_CAPSULE | Freq: Four times a day (QID) | ORAL | Status: DC | PRN

## 2021-12-25 MED ORDER — PRENATAL MULTIVITAMIN CH
1.0000 | ORAL_TABLET | Freq: Every day | ORAL | Status: DC
  Administered 2021-12-25 – 2021-12-26 (×2): 1 via ORAL
  Filled 2021-12-25 (×2): qty 1

## 2021-12-25 MED ORDER — MORPHINE SULFATE (PF) 0.5 MG/ML IJ SOLN
INTRAMUSCULAR | Status: DC | PRN
  Administered 2021-12-25: 3 mg via EPIDURAL

## 2021-12-25 MED ORDER — ACETAMINOPHEN 500 MG PO TABS
ORAL_TABLET | ORAL | Status: AC
  Administered 2021-12-25: 1000 mg
  Filled 2021-12-25: qty 2

## 2021-12-25 MED ORDER — BUPIVACAINE HCL (PF) 0.5 % IJ SOLN
INTRAMUSCULAR | Status: AC
  Filled 2021-12-25: qty 60

## 2021-12-25 MED ORDER — GABAPENTIN 300 MG PO CAPS
300.0000 mg | ORAL_CAPSULE | ORAL | Status: DC
  Filled 2021-12-25: qty 1

## 2021-12-25 MED ORDER — ENOXAPARIN SODIUM 80 MG/0.8ML IJ SOSY
0.5000 mg/kg | PREFILLED_SYRINGE | INTRAMUSCULAR | Status: DC
Start: 2021-12-25 — End: 2021-12-26
  Administered 2021-12-25: 62.5 mg via SUBCUTANEOUS
  Filled 2021-12-25: qty 0.63

## 2021-12-25 MED ORDER — GABAPENTIN 300 MG PO CAPS
300.0000 mg | ORAL_CAPSULE | Freq: Every day | ORAL | Status: DC
  Administered 2021-12-25: 300 mg via ORAL
  Filled 2021-12-25 (×2): qty 1

## 2021-12-25 MED ORDER — OXYCODONE HCL 5 MG/5ML PO SOLN: 5.0000 mg | Freq: Once | ORAL | Status: DC | PRN

## 2021-12-25 MED ORDER — CARBOPROST TROMETHAMINE 250 MCG/ML IM SOLN
INTRAMUSCULAR | Status: AC
  Filled 2021-12-25: qty 1

## 2021-12-25 MED ORDER — POVIDONE-IODINE 10 % EX SWAB
2.0000 | Freq: Once | CUTANEOUS | Status: AC
  Administered 2021-12-25: 2 via TOPICAL

## 2021-12-25 MED ORDER — NALOXONE HCL 4 MG/10ML IJ SOLN: 1.0000 ug/kg/h | INTRAVENOUS | Status: DC | PRN

## 2021-12-25 MED ORDER — CEFAZOLIN IN SODIUM CHLORIDE 3-0.9 GM/100ML-% IV SOLN
3.0000 g | Freq: Once | INTRAVENOUS | Status: AC
  Administered 2021-12-25: 3 g via INTRAVENOUS
  Filled 2021-12-25: qty 100

## 2021-12-25 MED ORDER — OXYCODONE HCL 5 MG PO TABS: 5.0000 mg | ORAL_TABLET | Freq: Once | ORAL | Status: DC | PRN

## 2021-12-25 MED ORDER — SIMETHICONE 80 MG PO CHEW: 80.0000 mg | CHEWABLE_TABLET | ORAL | Status: DC | PRN

## 2021-12-25 MED ORDER — OXYTOCIN-SODIUM CHLORIDE 30-0.9 UT/500ML-% IV SOLN
INTRAVENOUS | Status: DC | PRN
  Administered 2021-12-25: 250 mL via INTRAVENOUS

## 2021-12-25 MED ORDER — MORPHINE SULFATE (PF) 0.5 MG/ML IJ SOLN
INTRAMUSCULAR | Status: AC
  Filled 2021-12-25: qty 10

## 2021-12-25 MED ORDER — DEXMEDETOMIDINE (PRECEDEX) IN NS 20 MCG/5ML (4 MCG/ML) IV SYRINGE
PREFILLED_SYRINGE | INTRAVENOUS | Status: DC | PRN
  Administered 2021-12-25 (×2): 4 ug via INTRAVENOUS

## 2021-12-25 MED ORDER — SOD CITRATE-CITRIC ACID 500-334 MG/5ML PO SOLN
30.0000 mL | ORAL | Status: AC
  Administered 2021-12-25: 30 mL via ORAL

## 2021-12-25 MED ORDER — CEFAZOLIN SODIUM-DEXTROSE 1-4 GM/50ML-% IV SOLN
INTRAVENOUS | Status: AC
  Filled 2021-12-25: qty 50

## 2021-12-25 MED ORDER — OXYTOCIN-SODIUM CHLORIDE 30-0.9 UT/500ML-% IV SOLN
2.5000 [IU]/h | INTRAVENOUS | Status: AC
  Administered 2021-12-25: 2.5 [IU]/h via INTRAVENOUS

## 2021-12-25 MED ORDER — OXYCODONE HCL 5 MG PO TABS: 5.0000 mg | ORAL_TABLET | ORAL | Status: AC | PRN

## 2021-12-25 MED ORDER — NALOXONE HCL 0.4 MG/ML IJ SOLN: 0.4000 mg | INTRAMUSCULAR | Status: DC | PRN

## 2021-12-25 MED ORDER — CHLORHEXIDINE GLUCONATE 0.12 % MT SOLN
OROMUCOSAL | Status: AC
  Administered 2021-12-25: 15 mL
  Filled 2021-12-25: qty 15

## 2021-12-25 MED ORDER — CEFAZOLIN SODIUM-DEXTROSE 2-4 GM/100ML-% IV SOLN: 2.0000 g | INTRAVENOUS | Status: DC

## 2021-12-25 MED ORDER — DIBUCAINE (PERIANAL) 1 % EX OINT: 1.0000 | TOPICAL_OINTMENT | CUTANEOUS | Status: DC | PRN

## 2021-12-25 MED ORDER — ACETAMINOPHEN 325 MG PO TABS: 650.0000 mg | ORAL_TABLET | Freq: Four times a day (QID) | ORAL | Status: DC

## 2021-12-25 MED ORDER — SODIUM CHLORIDE 0.9% FLUSH: 3.0000 mL | INTRAVENOUS | Status: DC | PRN

## 2021-12-25 SURGICAL SUPPLY — 31 items
BACTOSHIELD CHG 4% 4OZ (MISCELLANEOUS) ×1
BARRIER ADHS 3X4 INTERCEED (GAUZE/BANDAGES/DRESSINGS) ×2 IMPLANT
CHLORAPREP W/TINT 26 (MISCELLANEOUS) ×2 IMPLANT
DRSG TELFA 3X8 NADH (GAUZE/BANDAGES/DRESSINGS) ×2 IMPLANT
ELECT CAUTERY BLADE 6.4 (BLADE) ×2 IMPLANT
ELECT REM PT RETURN 9FT ADLT (ELECTROSURGICAL) ×2
ELECTRODE REM PT RTRN 9FT ADLT (ELECTROSURGICAL) ×1 IMPLANT
GAUZE SPONGE 4X4 12PLY STRL (GAUZE/BANDAGES/DRESSINGS) ×2 IMPLANT
GLOVE SURG SYN 8.0 (GLOVE) ×2 IMPLANT
GLOVE SURG SYN 8.0 PF PI (GLOVE) ×1 IMPLANT
GOWN STRL REUS W/ TWL LRG LVL3 (GOWN DISPOSABLE) ×2 IMPLANT
GOWN STRL REUS W/ TWL XL LVL3 (GOWN DISPOSABLE) ×1 IMPLANT
GOWN STRL REUS W/TWL LRG LVL3 (GOWN DISPOSABLE) ×4
GOWN STRL REUS W/TWL XL LVL3 (GOWN DISPOSABLE) ×2
MANIFOLD NEPTUNE II (INSTRUMENTS) ×2 IMPLANT
MAT PREVALON FULL STRYKER (MISCELLANEOUS) ×2 IMPLANT
NEEDLE HYPO 22GX1.5 SAFETY (NEEDLE) ×2 IMPLANT
NS IRRIG 1000ML POUR BTL (IV SOLUTION) ×2 IMPLANT
PACK C SECTION AR (MISCELLANEOUS) ×2 IMPLANT
PAD DRESSING TELFA 3X8 NADH (GAUZE/BANDAGES/DRESSINGS) ×1 IMPLANT
PAD OB MATERNITY 4.3X12.25 (PERSONAL CARE ITEMS) ×2 IMPLANT
PAD PREP 24X41 OB/GYN DISP (PERSONAL CARE ITEMS) ×2 IMPLANT
SCRUB CHG 4% DYNA-HEX 4OZ (MISCELLANEOUS) ×1 IMPLANT
STAPLER INSORB 30 2030 C-SECTI (MISCELLANEOUS) ×1 IMPLANT
STRAP SAFETY 5IN WIDE (MISCELLANEOUS) ×2 IMPLANT
SUT CHROMIC 1 CTX 36 (SUTURE) ×6 IMPLANT
SUT PLAIN GUT 0 (SUTURE) ×4 IMPLANT
SUT VIC AB 0 CT1 36 (SUTURE) ×4 IMPLANT
SUT VICRYL 2-0 SH 8X27 (SUTURE) ×1 IMPLANT
SYR 30ML LL (SYRINGE) ×4 IMPLANT
WATER STERILE IRR 500ML POUR (IV SOLUTION) ×2 IMPLANT

## 2021-12-25 NOTE — Progress Notes (Signed)
Labor Progress Note  Denise [name] is a 21 y.o. G1P0 at [redacted]w[redacted]d by ultrasound admitted for induction of labor due to Hypertension and IUGR.  Subjective: resting in bed comfortably with epidural  Objective: BP 136/71   Pulse 99   Temp 98.1 F (36.7 C) (Oral)   Resp 16   Ht 5\' 1"  (1.549 m)   Wt 124.7 kg   LMP 05/13/2021 (Approximate) Comment: Patient awaiting her first prenatal appt.  SpO2 94%   BMI 51.96 kg/m  Notable VS details:   Fetal Assessment: FHT:  FHR: 80-120 bpm, variability: moderate,  accelerations:  Abscent,  decelerations:  Present recurrent variables, lates Category/reactivity:  Category II UC:   regular, every 2 minutes SVE:   5/80/-2 Membrane status: SROM Amniotic color: Clear  Labs: Lab Results  Component Value Date   WBC 5.4 12/24/2021   HGB 11.0 (L) 12/24/2021   HCT 35.7 (L) 12/24/2021   MCV 71.8 (L) 12/24/2021   PLT 262 12/24/2021    Assessment / Plan: -Called to bedside by RN for prolonged lates and recurrent lates after interventions  20 mins after patient laid down after epidural -Pitocin turned off -1000L fluid bolus given IUPC and FSE placed -maternal position changes -Terbutaline given  -Dr 02/24/2022  notified  Labor: Progressing normally,  Preeclampsia:  labs stable Fetal Wellbeing:  Category II Pain Control:  Epidural I/D:   SROM x 1.5 hours Anticipated MOD:   discussed with patient the possibility of delivery via c-section, for fetal intolerance  Denise [name] Denise [name] Denise [name], CNM 12/25/2021, 12:28 AM

## 2021-12-25 NOTE — Op Note (Unsigned)
Denise, [name] MEDICAL RECORD NO: [id number] ACCOUNT NO: [id number] DATE OF BIRTH: 12/26/2000 FACILITY: [facility] LOCATION: [facility] PHYSICIAN: Suzy Bouchard, MD  Operative Report   DATE OF PROCEDURE: 12/25/2021   PREOPERATIVE DIAGNOSIS:  1.  38+1 weeks' estimated gestational age. 2.  Fetal intolerance to labor with recurrent late decelerations.  POSTOPERATIVE DIAGNOSIS:   1.  38+1 weeks' estimated gestational age. 2.  Fetal intolerance to labor with recurrent late decelerations. 3.  Vigorous female, delivered.  PROCEDURE:  Primary low transverse cesarean section.  ANESTHESIA:  Surgical dosing of continuous lumbar epidural.  SURGEON:  Suzy Bouchard, MD  FIRST ASSISTANT: Chari Manning, certified nurse midwife.  INDICATIONS:  This is a 21 year old gravida 1, para 0, patient seen by maternal fetal medicine the day of induction, was noted to have estimated fetal weight of 9%, coupled with her history of gestational hypertension.  The patient was sent to labor and  delivery for induction.  The patient's induction was started and in active labor, fetus developed recurrent late decelerations, unresponsive to conservative therapy.  The patient was remote from delivery with cervix at 5 cm.  The patient was counseled  for the role of cesarean section.  DESCRIPTION OF PROCEDURE:  After surgical dosing of continuous lumbar epidural, the patient was placed in dorsal supine position with the hip roll on the right side.  The patient's abdomen was prepped and draped in normal sterile fashion.  She did  receive 3 grams IV Ancef and 500 mg azithromycin for surgical prophylaxis.  Timeout was performed and a Pfannenstiel incision was made 2 fingerbreadths above the symphysis pubis.  Sharp dissection was used to identify the fascia.  Fascia was opened in  the midline and opened in transverse fashion.  The superior aspect of the fascia was grasped with Kocher clamps and the  recti muscles were dissected free.  Inferior aspect of the fascia was grasped with Kocher clamps and pyramidalis muscle was dissected  free.  Entry into the peritoneal cavity was accomplished sharply.  Vesicouterine peritoneal fold was identified and the bladder flap was created and the bladder was reflected inferiorly.  Low transverse uterine incision was made.  Upon entry into the  endometrial cavity, clear fluid resulted.  Asynclitic wedged fetal head was elevated to the incision with fundal pressure, the head, shoulders and body were delivered, a vigorous female was then dried on mother's abdomen for 60 seconds.  Cord was doubly  clamped and vigorous female was passed to nursery staff who assigned Apgar scores of 8 and 9, fetal weight 3010 grams.  The placenta was then manually delivered and the uterus was exteriorized.  The endometrial cavity was wiped clean with laparotomy tape  and the uterine incision was closed with 1 chromic suture in a running locking fashion.  Two additional figure-of-eight sutures were required for good hemostasis.  Fallopian tubes and ovaries appeared normal.  Posterior cul-de-sac was irrigated and  suctioned and the uterus was placed back into the abdominal cavity.  Paracolic gutters were wiped clean with laparotomy tape and the uterine incision again appeared hemostatic.  Fascia was then closed with 0 Vicryl suture in a running nonlocking fashion,  two separate sutures used.  Fascial edges were then injected with a solution of 60 mL of 0.5% Marcaine plus 20 mL of normal saline, approximately 40 mL of the solution was injected.  Subcutaneous tissues were irrigated and bovied for hemostasis.  The  skin was reapproximated with Insorb absorbable staples and additional 30  mL of Marcaine solution was injected beneath the skin.  There were no complications.  QUANTITATIVE BLOOD LOSS:  480 mL.  INTRAOPERATIVE FLUIDS:  600 mL.  URINE OUTPUT:  500 mL  DISPOSITION:  The patient  was taken to recovery room in good condition.     [email or web address] D: 12/25/2021 3:38:46 am T: 12/25/2021 4:51:00 am  JOB: [id number]/ [id number]

## 2021-12-25 NOTE — Progress Notes (Signed)
Foley removed at 10:15am. Adequate output, patient ambulating without difficulty.

## 2021-12-25 NOTE — Transfer of Care (Signed)
Immediate Anesthesia Transfer of Care Note  Patient: Denise [name]  Procedure(s) Performed: CESAREAN SECTION  Patient Location: LDR2  Anesthesia Type:Epidural  Level of Consciousness: awake, alert  and oriented  Airway & Oxygen Therapy: Patient Spontanous Breathing  Post-op Assessment: Report given to RN and Post -op Vital signs reviewed and stable  Post vital signs: Reviewed and stable  Last Vitals:  Vitals Value Taken Time  BP 149/91   Temp    Pulse 109   Resp 12   SpO2 98     Last Pain:  Vitals:   12/24/21 1910  TempSrc: Oral  PainSc:       Patients Stated Pain Goal: 0 (12/24/21 1737)  Complications: No notable events documented.

## 2021-12-25 NOTE — Progress Notes (Signed)
Patient ID: Denise [name], female   DOB: Sep 05, 2000, 21 y.o.   MRN: [id number] Pt with non reasurring fetal monitoring with recurrent late decels , remote form delivery .  I recommend primary LTCS . I have explained the reason for the surgery . The risks of cesarean section discussed with the patient included but were not limited to: bleeding which may require transfusion or reoperation; infection which may require antibiotics; injury to bowel, bladder, ureters or other surrounding organs; injury to the fetus; need for additional procedures including hysterectomy in the event of a life-threatening hemorrhage; placental abnormalities wth subsequent pregnancies, incisional problems, thromboembolic phenomenon and other postoperative/anesthesia complications. The patient concurred with the proposed plan, giving informed written consent for the procedure.   she will remain NPO for procedure. Anesthesia and OR aware. Preoperative prophylactic antibiotics and SCDs ordered on call to the OR.  To OR when ready.

## 2021-12-25 NOTE — Op Note (Unsigned)
  The note originally documented on this encounter has been moved the the encounter in which it belongs.  

## 2021-12-25 NOTE — Anesthesia Post-op Follow-up Note (Signed)
  Anesthesia Pain Follow-up Note  Patient: Denise [name]  Day #: 1  Date of Follow-up: 12/25/2021 Time: 7:56 AM  Last Vitals:  Vitals:   12/25/21 0530 12/25/21 0620  BP: 129/77 124/71  Pulse: 95 100  Resp: (!) 24 18  Temp:  36.5 C  SpO2: 95% 98%    Level of Consciousness: alert  Pain: none   Side Effects:None  Catheter Site Exam:clean, dry, no drainage  Anti-Coag Meds (From admission, onward)   Start     Dose/Rate Route Frequency Ordered Stop   12/25/21 1800  enoxaparin (LOVENOX) injection 62.5 mg        0.5 mg/kg  124.7 kg Subcutaneous Every 24 hours 12/25/21 0359         Plan: D/C from anesthesia care at surgeon's request  Emon Miggins B Alonza Smoker

## 2021-12-25 NOTE — Progress Notes (Addendum)
Labor Progress Note  Denise [name] is a 21 y.o. G1P0 at [redacted]w[redacted]d by ultrasound admitted for induction of labor due to Hypertension and IUGR.  Subjective: resting in bed comfortably with epidural  Objective: BP 136/71   Pulse 99   Temp 98.1 F (36.7 C) (Oral)   Resp 16   Ht 5\' 1"  (1.549 m)   Wt 124.7 kg   LMP 05/13/2021 (Approximate) Comment: Patient awaiting her first prenatal appt.  SpO2 94%   BMI 51.96 kg/m  Notable VS details:   Fetal Assessment: FHT:  FHR: 155 bpm, variability: moderate,  accelerations:  Present,  decelerations:  Present recurrent late and variable declerations Category/reactivity:  Category II UC:   regular, every 2-3 minutes SVE:  5/80/-2  Membrane status: SROM Amniotic color: Clear  Labs: Lab Results  Component Value Date   WBC 5.4 12/24/2021   HGB 11.0 (L) 12/24/2021   HCT 35.7 (L) 12/24/2021   MCV 71.8 (L) 12/24/2021   PLT 262 12/24/2021    Assessment / Plan: -Fetal intolerance to labor , despite interventions -recurrent late and variable declarations - cervix unchanged still remote from delivery -unable to augment labor due to [facility] -Discussed with patient probability of c-section d/t fetal intolerance, patient amendable -Dr [facility] notified and aware of labor events   Labor: Fetal intolerance to labor Preeclampsia:  labs stable Fetal Wellbeing:  Category II Pain Control:  Epidural I/D:   SROM x 4 hours Anticipated MOD:   C-Section  Kade Rickels [name] Kahlyn Shippey, CNM 12/25/2021, 2:10 AM

## 2021-12-25 NOTE — Progress Notes (Addendum)
Patient request to change positions, patient currently in knee chest position. Patient moved to high fowlers  position, late deceleration noted on monitor. Patient repositioned to left lateral and continued to have late decelerations, patient moved to right lateral. Late decelerations resolved. FHT  Category 2 tracing 155 moderate variability+accelerations, early decelerations.  Chari Manning CNM

## 2021-12-25 NOTE — Anesthesia Postprocedure Evaluation (Signed)
Anesthesia Post Note  Patient: Denise [name]  Procedure(s) Performed: CESAREAN SECTION  Patient location during evaluation: Mother Baby Anesthesia Type: Epidural Level of consciousness: awake and alert Pain management: pain level controlled Vital Signs Assessment: post-procedure vital signs reviewed and stable Respiratory status: spontaneous breathing, nonlabored ventilation and respiratory function stable Cardiovascular status: stable Postop Assessment: no headache, no backache and epidural receding Anesthetic complications: no   No notable events documented.   Last Vitals:  Vitals:   12/25/21 0530 12/25/21 0620  BP: 129/77 124/71  Pulse: 95 100  Resp: (!) 24 18  Temp:  36.5 C  SpO2: 95% 98%    Last Pain:  Vitals:   12/25/21 0740  TempSrc:   PainSc: 6                  Antoria Lanza B Alonza Smoker

## 2021-12-25 NOTE — Lactation Note (Signed)
This note was copied from a baby's chart. Lactation Consultation Note  Patient Name: Denise [name] HWYSH'U Date: 12/25/2021   Age:21 hours  Offered mom to assist her with breastfeeding her baby this morning and again this afternoon. Mom declines LC assistance. She reports "baby is taking the bottle." When clarifying mom's feeding plan she reports she is going to breast and bottle feed. She reports "I put the baby to the breast this morning." Update provided to care nurse.   Fuller Song 12/25/2021, 3:12 PM

## 2021-12-25 NOTE — Brief Op Note (Signed)
12/24/2021 - 12/25/2021  3:26 AM  PATIENT:  Denise [name]  20 y.o. female  PRE-OPERATIVE DIAGNOSIS: fetal intolerance to labor   POST-OPERATIVE DIAGNOSIS:same PROCEDURE:  Procedure(s): CESAREAN SECTION LTCS SURGEON:  Surgeon(s) and Role:    * Sye Schroepfer, Ihor Austin, MD - Primary  PHYSICIAN ASSISTANT: Chari Manning , CNM   ASSISTANTS: none   ANESTHESIA:   epidural  EBL:  QBL 480 cc, IOF 600 cc UO 500 cc  BLOOD ADMINISTERED:none  DRAINS: Urinary Catheter (Foley)   LOCAL MEDICATIONS USED:  MARCAINE     SPECIMEN:  No Specimen  DISPOSITION OF SPECIMEN:  N/A  COUNTS:  YES  TOURNIQUET:  * No tourniquets in log *  DICTATION: .Other Dictation: Dictation Number verbal  PLAN OF CARE: Admit to inpatient   PATIENT DISPOSITION:  PACU - hemodynamically stable.   Delay start of Pharmacological VTE agent (>24hrs) due to surgical blood loss or risk of bleeding: not applicable

## 2021-12-25 NOTE — Progress Notes (Signed)
Obstetrical Discharge Summary  Patient Name: Denise [name] DOB: 03/06/2001 MRN: [id number]  Date of Admission: 12/24/2021 Date of Delivery: 12/25/21 Delivered by: Beverly Gust MD Date of Discharge:***  Primary OB: [facility]  DJM:[id number]'D last menstrual period was 05/13/2021 (approximate). EDC Estimated Date of Delivery: 01/06/22 Gestational Age at Delivery: [redacted]w[redacted]d   Antepartum complications: IUGR, gestational HTN  Admitting Diagnosis:  Secondary Diagnosis: Patient Active Problem List   Diagnosis Date Noted   Encounter for induction of labor 12/24/2021   Supervision of high risk pregnancy in third trimester 07/17/2021   Morbid obesity (HCC) 230 lbs 03/03/2021   Elevated blood pressure reading without diagnosis of hypertension 09/18/2018   Post traumatic stress disorder (PTSD) 02/20/2014   Conduct disorder, 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   ADHD (attention deficit hyperactivity disorder), 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    Augmentation: Pitocin and Cytotec Complications: recurrent late decels  Intrapartum complications/course: as above , unresponsive to conservative management .Remot from delivery  Date of Delivery: 12/25/2021  Delivered By: Beverly Gust MD Delivery Type: {delivery type:32078} Anesthesia: epidural Placenta: manual Laceration:  Episiotomy: none Newborn Data: Live born   Birth Weight:  female  weight 3010 gm  APGAR: , 8/9  Newborn Delivery   Birth date/time:  Delivery type:         Postpartum Procedures: ***  Edinburgh:      No data to display          Post partum course: *** Patient had an uncomplicated postpartum course.  By time of discharge on PPD#***, her pain was controlled on oral pain medications; she had appropriate lochia and was ambulating, voiding without difficulty and tolerating regular diet.  She was deemed stable for discharge to home.    ***(Cesarean Section):  Patient had an uncomplicated postpartum course.  By  time of discharge on POD#***, her pain was controlled on oral pain medications; she had appropriate lochia and was ambulating, voiding without difficulty, tolerating regular diet and passing flatus.   She was deemed stable for discharge to home.    Discharge Physical Exam: *** BP 136/71   Pulse 99   Temp 98.1 F (36.7 C) (Oral)   Resp 16   Ht 5\' 1"  (1.549 m)   Wt 124.7 kg   LMP 05/13/2021 (Approximate) Comment: Patient awaiting her first prenatal appt.  SpO2 94%   BMI 51.96 kg/m   General: NAD CV: RRR Pulm: CTABL, nl effort ABD: s/nd/nt, fundus firm and below the umbilicus Lochia: moderate Incision: c/d/i DVT Evaluation: LE non-ttp, no evidence of DVT on exam.  Hemoglobin  Date Value Ref Range Status  12/24/2021 11.0 (L) 12.0 - 15.0 g/dL Final   HGB  Date Value Ref Range Status  02/18/2014 12.4 12.0 - 16.0 g/dL Final   HCT  Date Value Ref Range Status  12/24/2021 35.7 (L) 36.0 - 46.0 % Final  02/18/2014 38.5 35.0 - 47.0 % Final     Disposition: stable, discharge to home. Baby Feeding: breastmilk***formula Baby Disposition: home with mom  Rh Immune globulin given:  Rubella vaccine given:  Tdap vaccine given in AP or PP setting:  Flu vaccine given in AP or PP setting:   Contraception: ***  Prenatal Labs:   Blood type/Rh A Pos  Antibody screen neg  Rubella Immune  Varicella Immune  RPR NR  HBsAg Neg  Hep C NR   HIV NR  GC neg  Chlamydia neg  Genetic screening cfDNA negative   1 hour GTT 163  3 hour GTT [phone]  GBS neg    Plan:  Denise [name] was discharged to home in good condition. Follow-up appointment with delivering provider in 6 weeks.  Discharge Medications: Allergies as of 12/25/2021       Reactions   Peanut-containing Drug Products Swelling   Documented in [facility] ED record     Med Rec must be completed prior to using this [facility]***         Signed: *** Hit refresh and delete this line

## 2021-12-26 MED ORDER — ENOXAPARIN SODIUM 60 MG/0.6ML IJ SOSY
60.0000 mg | PREFILLED_SYRINGE | INTRAMUSCULAR | Status: DC
  Filled 2021-12-26: qty 0.6

## 2021-12-26 MED ORDER — SIMETHICONE 80 MG PO CHEW: 80.0000 mg | CHEWABLE_TABLET | Freq: Three times a day (TID) | ORAL | 0 refills | Status: DC

## 2021-12-26 MED ORDER — OXYCODONE HCL 5 MG PO TABS: 5.0000 mg | ORAL_TABLET | ORAL | 0 refills | Status: AC | PRN

## 2021-12-26 MED ORDER — ACETAMINOPHEN 500 MG PO TABS: 1000.0000 mg | ORAL_TABLET | Freq: Four times a day (QID) | ORAL | 0 refills | Status: DC

## 2021-12-26 MED ORDER — ENOXAPARIN SODIUM 60 MG/0.6ML IJ SOSY: 60.0000 mg | PREFILLED_SYRINGE | INTRAMUSCULAR | 0 refills | Status: DC

## 2021-12-26 MED ORDER — SENNOSIDES-DOCUSATE SODIUM 8.6-50 MG PO TABS: 2.0000 | ORAL_TABLET | Freq: Every day | ORAL | Status: DC

## 2021-12-26 MED ORDER — IBUPROFEN 600 MG PO TABS: 600.0000 mg | ORAL_TABLET | Freq: Four times a day (QID) | ORAL | 0 refills | Status: DC

## 2021-12-26 NOTE — Progress Notes (Signed)
Post Partum Day 1 Subjective: Doing well, no complaints.  Tolerating regular diet, pain with PO meds, voiding and ambulating without difficulty.  No CP SOB Fever,Chills, N/V or leg pain; denies nipple or breast pain, no HA change of vision, RUQ/epigastric pain  Objective: BP 140/84 (BP Location: Right Arm)   Pulse (!) 104   Temp 98.8 F (37.1 C) (Oral)   Resp 18   Ht 5\' 1"  (1.549 m)   Wt 124.7 kg   LMP 05/13/2021 (Approximate) Comment: Patient awaiting her first prenatal appt.  SpO2 100%   Breastfeeding Unknown   BMI 51.96 kg/m    Physical Exam:  General: NAD Breasts: soft/nontender CV: RRR Pulm: nl effort, CTABL Abdomen: soft, NT, BS x 4 Incision: Dsg CDI/no erythema or drainage Lochia: moderate Uterine Fundus: fundus firm and 1 fb below umbilicus DVT Evaluation: no cords, ttp LEs   Recent Labs    12/24/21 1149 12/25/21 0618  HGB 11.0* 9.5*  HCT 35.7* 30.8*  WBC 5.4 9.3  PLT 262 218    Assessment/Plan: 20 y.o. G1P1001 postpartum day # 1  - Continue routine PP care - Lactation consult - Discussed contraceptive options including implant, IUDs hormonal and non-hormonal, injection, pills/ring/patch, condoms, and NFP. She is undecided. - Acute blood loss anemia - hemodynamically stable and asymptomatic; start po ferrous sulfate BID with stool softeners  - Immunization status: all Imms up to date  Disposition: Does desire Dc home today. I discussed with her she is only one POD1 and we typically recommend staying until POD2 to make sure she is adjusting well after a c-section. She will stay until this afternoon then we will re-evaluate how she is feeling and may discharge her if stable. She is agreeable with POC.  02/25/22, CNM 12/26/2021 9:35 AM

## 2021-12-26 NOTE — Lactation Note (Signed)
This note was copied from a baby's chart. Lactation Consultation Note  Patient Name: Girl Jaydalyn Demattia ZTIWP'Y Date: 12/26/2021   Age:21 hours  Offered mom this morning assistance with breastfeeding. Per mom she has latched the baby 3 times and one time the baby breastfeed for 10 minutes. After any breastfeeding attempts baby is bottle fed formula. At this time mom declines LC assistance but was open to me leaving the as com phone number to call LC if she would like breastfeeding assistance today. Encouraged mom to call [facility] and mom verbalized she would let me if she needed my assistance. Update provided to care nurse.     Fuller Song RN,BSN,IBCLC 12/26/2021, 11:18 AM

## 2021-12-26 NOTE — Progress Notes (Signed)
Patient educated on discharge instructions, medications and follow up appointments. Patient verbalized understanding of instructions and appropriate for discharge per physician. Patient discharged with infant and escorted out by RN.

## 2021-12-28 ENCOUNTER — Encounter: Payer: Self-pay | Admitting: Obstetrics and Gynecology

## 2022-01-01 ENCOUNTER — Other Ambulatory Visit: Payer: Self-pay

## 2022-01-01 ENCOUNTER — Emergency Department
Admission: EM | Admit: 2022-01-01 | Discharge: 2022-01-01 | Disposition: A | Discharge status: Home / Self Care | Payer: Medicaid Other | Attending: Emergency Medicine | Admitting: Emergency Medicine

## 2022-01-01 DIAGNOSIS — L089 Local infection of the skin and subcutaneous tissue, unspecified: Secondary | ICD-10-CM

## 2022-01-01 DIAGNOSIS — T8149XA Infection following a procedure, other surgical site, initial encounter: Secondary | ICD-10-CM | POA: Diagnosis present

## 2022-01-01 DIAGNOSIS — Z4801 Encounter for change or removal of surgical wound dressing: Secondary | ICD-10-CM | POA: Insufficient documentation

## 2022-01-01 LAB — CBC WITH DIFFERENTIAL/PLATELET
Abs Immature Granulocytes: 0.01 10*3/uL (ref 0.00–0.07)
Basophils Absolute: 0 10*3/uL (ref 0.0–0.1)
Basophils Relative: 1 %
Eosinophils Absolute: 0.2 10*3/uL (ref 0.0–0.5)
Eosinophils Relative: 3 %
HCT: 32.9 % — ABNORMAL LOW (ref 36.0–46.0)
Hemoglobin: 9.9 g/dL — ABNORMAL LOW (ref 12.0–15.0)
Immature Granulocytes: 0 %
Lymphocytes Relative: 42 %
Lymphs Abs: 1.9 10*3/uL (ref 0.7–4.0)
MCH: 22.1 pg — ABNORMAL LOW (ref 26.0–34.0)
MCHC: 30.1 g/dL (ref 30.0–36.0)
MCV: 73.4 fL — ABNORMAL LOW (ref 80.0–100.0)
Monocytes Absolute: 0.5 10*3/uL (ref 0.1–1.0)
Monocytes Relative: 11 %
Neutro Abs: 2 10*3/uL (ref 1.7–7.7)
Neutrophils Relative %: 43 %
Platelets: 394 10*3/uL (ref 150–400)
RBC: 4.48 MIL/uL (ref 3.87–5.11)
RDW: 14.8 % (ref 11.5–15.5)
WBC: 4.6 10*3/uL (ref 4.0–10.5)
nRBC: 0 % (ref 0.0–0.2)

## 2022-01-01 LAB — BASIC METABOLIC PANEL
Anion gap: 7 (ref 5–15)
BUN: 10 mg/dL (ref 6–20)
CO2: 23 mmol/L (ref 22–32)
Calcium: 9.1 mg/dL (ref 8.9–10.3)
Chloride: 108 mmol/L (ref 98–111)
Creatinine, Ser: 0.97 mg/dL (ref 0.44–1.00)
GFR, Estimated: >60 mL/min (ref >60)
Glucose, Bld: 81 mg/dL (ref 70–99)
Potassium: 3.9 mmol/L (ref 3.5–5.1)
Sodium: 138 mmol/L (ref 135–145)

## 2022-01-01 MED ORDER — CEPHALEXIN 500 MG PO CAPS
500.0000 mg | ORAL_CAPSULE | Freq: Once | ORAL | Status: AC
  Administered 2022-01-01: 500 mg via ORAL
  Filled 2022-01-01: qty 1

## 2022-01-01 MED ORDER — CEPHALEXIN 500 MG PO CAPS: 500.0000 mg | ORAL_CAPSULE | Freq: Four times a day (QID) | ORAL | 0 refills | Status: DC

## 2022-01-01 NOTE — ED Triage Notes (Signed)
Pt to ED with mother, pt had low transverse cesarean section on 7/7 and states that since yesterday has felt area to L side of incision is "aching". Area examined with provider in triage, pt has honeycomb dressing to wound, skin around wound does not appear reddened or swollen. No purulent drainage noted. There is one area to L side of incision that has brown-colored drainage noted to dressing.

## 2022-01-01 NOTE — Discharge Instructions (Addendum)
It appears that you have a very small infection starting along the incision from her cesarean section.  It is important to start antibiotic and get close follow-up with Dr. Feliberto Gottron on Monday.  The OB clinic advised the will be happy to see you Monday.  Please call to make an affirm that appointment  Return to the emergency room right away if you develop increasing pain, develop abdominal pain, fever, shaking chills weakness vomiting or noticed that the area is becoming red and swollen or additional pus or other concerns arise.

## 2022-01-01 NOTE — ED Provider Notes (Signed)
[facility] Provider Note    Event Date/Time   First MD Initiated Contact with Patient 01/01/22 1255     (approximate)   History   Wound Check   HPI  Denise [name] is a 21 y.o. female who on review of records had a cesarean section on July 7 of this year with Dr. Feliberto Gottron  Yesterday evening patient noticed that the area felt just a little bit irritated along the left lower edge of her abdominal incision from her C-section.  She also noticed throughout today that there is a small amount of yellow to slightly brown discoloration overlying the bandage that she has had on there since surgery.  No fevers or chills no abdominal pain.  Having small amount of vaginal bleeding that is improving.  The area of discomfort is very small, is located just inside the left edge of the infection.     Physical Exam   Triage Vital Signs: ED Triage Vitals  Enc Vitals Group     BP 01/01/22 1156 (!) 149/94     Pulse Rate 01/01/22 1156 82     Resp 01/01/22 1156 16     Temp 01/01/22 1156 98.6 F (37 C)     Temp Source 01/01/22 1156 Oral     SpO2 01/01/22 1156 100 %     Weight 01/01/22 1157 275 lb 9.2 oz (125 kg)     Height 01/01/22 1157 5\' 1"  (1.549 m)     Head Circumference --      Peak Flow --      Pain Score 01/01/22 1157 4     Pain Loc --      Pain Edu? --      Excl. in GC? --     Most recent vital signs: Vitals:   01/01/22 1156 01/01/22 1253  BP: (!) 149/94 140/88  Pulse: 82 80  Resp: 16 16  Temp: 98.6 F (37 C)   SpO2: 100% 100%     General: Awake, no distress.  Very pleasant.  Mother also present CV:  Good peripheral perfusion.  Resp:  Normal effort.  Abd:  No distention.  Abdomen is soft nontender nondistended throughout.  No discomfort reported to palpation in the abdominal quadrants abutting the cesarean section  Bandages removed, honeycomb dressing removed and there is a small area about the size of a quarter or dime with a very  slight yellowish discoloration and it overlies the area where the patient reports some discomfort.  There is no frank pus or purulence draining at this time the skin feels just slightly warm at this site but no obvious erythema.  I am not able to express any sort of pus by palpation, and there is no area of fluctuance or obvious abscess formation.  Patient reports area is just slightly sore but not exquisitely tender.  There is no dehiscence of the wound and the remaining portions of the wound are very much clean dry and intact.  Other:  Moves all extremities well without difficulty  Patient reports she is no longer breast-feeding   ED Results / Procedures / Treatments   Labs (all labs ordered are listed, but only abnormal results are displayed) Labs Reviewed  CBC WITH DIFFERENTIAL/PLATELET - Abnormal; Notable for the following components:      Result Value   Hemoglobin 9.9 (*)    HCT 32.9 (*)    MCV 73.4 (*)    MCH 22.1 (*)    All other components  within normal limits  BASIC METABOLIC PANEL    RADIOLOGY  At this time my pretest probability for large abscess or fistula tract is low.  I do not see indication for imaging at this time suspect infection is limited primarily to the epidermis or outer edges of the incision   PROCEDURES:  Critical Care performed: No  Procedures   MEDICATIONS ORDERED IN ED: Medications  cephALEXin (KEFLEX) capsule 500 mg (has no administration in time range)     IMPRESSION / MDM / ASSESSMENT AND PLAN / ED COURSE  I reviewed the triage vital signs and the nursing notes.                              Differential diagnosis includes, but is not limited to, surgical site infection, abscess, fistula, wound infection, cellulitis, etc.  Patient's presentation is most consistent with acute complicated illness / injury requiring diagnostic workup.   Clinical Course as of 01/01/22 1359  Fri Jan 01, 2022  1339 Labs reviewed, normal chemistry.  CBC  with anemia, but this is pre-existing on review of old [MQ]    Clinical Course User Index [MQ] Sharyn Creamer, MD   Medical decision making suggesting that there is likely a small localized area of infection developing along the surgical site.  I discussed the case with Dr. Dalbert Garnet who is on-call for Dr. Feliberto Gottron, she recommends at this point that we start patient on cephalexin and her clinic will call and assure patient has an appointment Monday.  I have provided this recommendation to the patient and she will also be calling the clinic to schedule an appointment for Monday with OB/GYN team for reevaluation  In the interim patient will be on antibiotic and I discussed careful return precautions with her as well as her mother both agreeable with plan.  FINAL CLINICAL IMPRESSION(S) / ED DIAGNOSES   Final diagnoses:  Wound infection  Surgical site infection     Rx / DC Orders   ED Discharge Orders          Ordered    cephALEXin (KEFLEX) 500 MG capsule  4 times daily        01/01/22 1356             Note:  This document was prepared using Dragon voice recognition software and may include unintentional dictation errors.   Sharyn Creamer, MD 01/01/22 1359

## 2022-01-01 NOTE — ED Provider Triage Note (Signed)
Emergency Medicine Provider Triage Evaluation Note  Denise [name] , a 21 y.o. female  was evaluated in triage.  Pt complains of aching in area a c-section that was performed on 12/25/21. No known fever.  Physical Exam  There were no vitals taken for this visit. Gen:   Awake, no distress   Resp:  Normal effort  MSK:   Moves extremities without difficulty  Other:  Purulent drainage noted from dressing in area of c-section.  Medical Decision Making  Medically screening exam initiated at 11:54 AM.  Appropriate orders placed.  Denise [name] was informed that the remainder of the evaluation will be completed by another provider, this initial triage assessment does not replace that evaluation, and the importance of remaining in the ED until their evaluation is complete.  VS normal. Screening labs drawn.   Chinita Pester, FNP 01/01/22 1157

## 2022-01-06 NOTE — Discharge Summary (Signed)
Obstetrical Discharge Summary   Patient Name: Denise [name] DOB: Jul 05, 2000 MRN: [id number]   Date of Admission: 12/24/2021 Date of Delivery: 12/25/21 Delivered by: Beverly Gust MD Date of Discharge: 12/26/2021   Primary OB: [facility]  [id number] last menstrual period was 05/13/2021 (approximate). 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   ADHD (attention deficit hyperactivity disorder), 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      Augmentation: Pitocin and Cytotec Complications: recurrent late decels  Intrapartum complications/course: as above , unresponsive to conservative management .Remote from delivery  Date of Delivery: 12/26/2021   Delivered By: Beverly Gust MD Delivery Type: primary cesarean section, low vertical incision Anesthesia: epidural Placenta: manual Laceration: n/a Episiotomy: none Newborn Data: Live born   Birth Weight:  female  weight 3010 gm  APGAR: , 8/9   Newborn Delivery   Birth date/time:  Delivery type:         Postpartum Procedures: none   Edinburgh:      12/25/2021    1:30 PM  Edinburgh Postnatal Depression Scale Screening Tool  I have been able to laugh and see the funny side of things. 0  I have looked forward with enjoyment to things. 1  I have blamed myself unnecessarily when things went wrong. 0  I have been anxious or worried for no good reason. 0  I have felt scared or panicky  for no good reason. 0  Things have been getting on top of me. 1  I have been so unhappy that I have had difficulty sleeping. 0  I have felt sad or miserable. 0  I have been so unhappy that I have been crying. 0  The thought of harming myself has occurred to me. 0  Edinburgh Postnatal Depression Scale Total 2    Post partum course:   Patient had an uncomplicated postpartum course.  By time of discharge on POD#1, her pain was controlled on oral pain medications; she had appropriate lochia and was ambulating, voiding without difficulty, tolerating regular diet and passing flatus.   She was deemed stable for discharge to home.     Discharge Physical Exam:   BP 134/87 (BP Location: Right Arm)   Pulse 98   Temp 98.8 F (37.1 C) (Oral)   Resp 18   Ht 5\' 1"  (1.549 m)   Wt 124.7 kg   LMP 05/13/2021 (Approximate) Comment: Patient awaiting her first prenatal appt.  SpO2 98%   Breastfeeding Unknown   BMI 51.96 kg/m    General: NAD CV: RRR Pulm: CTABL, nl effort ABD: s/nd/nt, fundus firm and below the umbilicus Lochia: moderate Incision: c/d/i DVT Evaluation: LE non-ttp, no evidence of DVT on exam.   Last Labs       Hemoglobin  Date Value Ref Range Status  12/25/2021 9.5 (L) 12.0 - 15.0 g/dL Final         HGB  Date Value Ref Range Status  02/18/2014 12.4 12.0 - 16.0 g/dL Final  HCT  Date Value Ref Range Status  12/25/2021 30.8 (L) 36.0 - 46.0 % Final  02/18/2014 38.5 35.0 - 47.0 % Final          Disposition: stable, discharge to home. Baby Feeding: breastmilk and formula Baby Disposition: home with mom   Rh Immune globulin given: n/a, A pos Rubella vaccine given: n/a, immune Tdap vaccine given in AP or PP setting: declined Flu vaccine given in AP or PP setting: declined   Contraception: TBD, she is undecided   Prenatal Labs:    Blood type/Rh A Pos  Antibody screen neg  Rubella Immune  Varicella Immune  RPR NR  HBsAg Neg  Hep C NR   HIV NR  GC neg   Chlamydia neg  Genetic screening cfDNA negative   1 hour GTT 163  3 hour GTT [phone]  GBS neg      Plan:  Denise [name] was discharged to home in good condition. Follow-up appointment with delivering provider in 6 weeks.   Discharge Medications: Allergies as of 12/26/2021         Reactions    Peanut-containing Drug Products Swelling    Documented in [facility] ED record            Medication List       STOP taking these medications     aspirin 81 MG chewable tablet           TAKE these medications     acetaminophen 500 MG tablet Commonly known as: TYLENOL Take 2 tablets (1,000 mg total) by mouth every 6 (six) hours.    enoxaparin 60 MG/0.6ML injection Commonly known as: LOVENOX Inject 0.6 mLs (60 mg total) into the skin daily.    ferrous sulfate 325 (65 FE) MG tablet Take 325 mg by mouth daily with breakfast.    ibuprofen 600 MG tablet Commonly known as: ADVIL Take 1 tablet (600 mg total) by mouth every 6 (six) hours.    oxyCODONE 5 MG immediate release tablet Commonly known as: Oxy IR/ROXICODONE Take 1 tablet (5 mg total) by mouth every 4 (four) hours as needed for up to 7 days for moderate pain.    Prenatal 19 tablet Take 1 tablet by mouth daily.    senna-docusate 8.6-50 MG tablet Commonly known as: Senokot-S Take 2 tablets by mouth daily. Start taking on: December 27, 2021    simethicone 80 MG chewable tablet Commonly known as: MYLICON Chew 1 tablet (80 mg total) by mouth 3 (three) times daily after meals.               Follow-up Information       Schermerhorn, Ihor Austin, MD Follow up in 2 week(s).   Specialty: Obstetrics and Gynecology Why: 2wk incision check Contact information: [address] [place] [address] [phone]              Schermerhorn, Ihor Austin, MD Follow up in 6 week(s).   Specialty: Obstetrics and Gynecology Why: 6wk postpartum Contact  information: [address] [place] [address] [phone]                          Signed: Blanchard Kelch 12/26/2021 4:49 PM

## 2022-01-21 ENCOUNTER — Other Ambulatory Visit: Payer: Medicaid Other

## 2022-02-03 ENCOUNTER — Ambulatory Visit: Payer: Medicaid Other

## 2022-02-10 LAB — HM HIV SCREENING LAB: HM HIV Screening: NEGATIVE

## 2022-02-11 ENCOUNTER — Ambulatory Visit: Payer: Medicaid Other | Admitting: Physician Assistant

## 2022-02-11 ENCOUNTER — Encounter: Payer: Self-pay | Admitting: Physician Assistant

## 2022-02-11 DIAGNOSIS — Z30013 Encounter for initial prescription of injectable contraceptive: Secondary | ICD-10-CM | POA: Diagnosis not present

## 2022-02-11 LAB — HEMOGLOBIN, FINGERSTICK: Hemoglobin: 11.5 g/dL (ref 11.1–15.9)

## 2022-02-11 LAB — WET PREP FOR TRICH, YEAST, CLUE
Trichomonas Exam: NEGATIVE
Yeast Exam: NEGATIVE

## 2022-02-11 MED ORDER — MEDROXYPROGESTERONE ACETATE 150 MG/ML IM SUSP
150.0000 mg | INTRAMUSCULAR | Status: AC
  Administered 2022-02-11: 150 mg via INTRAMUSCULAR

## 2022-02-11 NOTE — Progress Notes (Signed)
[facility] Department  Postpartum Exam  Denise [name] is a 21 y.o. G55P1001 female who presents for a postpartum visit. She is 7 weeks postpartum following a primary cesarean section.  I have fully reviewed the prenatal and intrapartum course. The delivery was at 36 gestational weeks.  Anesthesia: epidural. Postpartum course has been complicated by ED visit for possible C/s wound infection 01/01/22 - was told to f/u with delivering provider, but apparently did not. Baby is doing well. Baby is feeding by bottle - Similac Advance. Bleeding  on 3rd day of menses . Bowel function is normal. Bladder function is normal. Patient is sexually active. Contraception method is condoms. Postpartum depression screening: negative.   The pregnancy intention screening data noted above was reviewed. Potential methods of contraception were discussed. The patient elected to proceed with Contraceptive Patch; Hormonal Injection.    Health Maintenance Due  Topic Date Due   COVID-19 Vaccine (1) Never done   HPV VACCINES (1 - 2-dose series) Never done   Hepatitis C Screening  Never done   PAP-Cervical Cytology Screening  12/27/2021   PAP SMEAR-Modifier  12/27/2021   INFLUENZA VACCINE  01/19/2022    The following portions of the patient's history were reviewed and updated as appropriate: allergies, current medications, past family history, past medical history, past social history, past surgical history, and problem list.  Review of Systems A comprehensive review of systems was negative.  Objective:  BP 115/78 (BP Location: Left Arm, Patient Position: Sitting, Cuff Size: Large)   Pulse 71   Temp 98.2 F (36.8 C)   Ht 5\' 1"  (1.549 m)   Wt 253 lb (114.8 kg)   LMP 02/08/2022 (Exact Date)   Breastfeeding No   BMI 47.80 kg/m    General:  alert, cooperative, appears stated age, no distress, and morbidly obese   Breasts:  No dominant mass, skin changes or erythema  Lungs: clear to auscultation  bilaterally  Heart:  regular rate and rhythm, S1, S2 normal, no murmur, click, rub or gallop  Abdomen: soft, non-tender; bowel sounds normal; no masses,  no organomegaly   Wound well approximated incision, no tenderness, erythema or exudate  GU exam:  normal       Assessment:    1. Postpartum exam Normal postpartum exam. - Pap IG (Image Guided) - Chlamydia/Gonorrhea [place] [facility] - Syphilis Serology, [place] [facility] - HIV [place] [facility] - Hemoglobin, fingerstick - WET PREP FOR TRICH, YEAST, CLUE  2. Encounter for initial prescription of injectable contraceptive Start DMPA today, repeat every 3 months for 1 year. Use backup contraception for 1 week. - medroxyPROGESTERone (DEPO-PROVERA) injection 150 mg  Plan:   Essential components of care per ACOG recommendations:  1.  Mood and well being: Patient with negative depression screening today. Reviewed local resources for support.  - Patient tobacco use? No.   - hx of drug use? No.    2. Infant care and feeding:  -Patient currently breastmilk feeding? No.  -Social determinants of health (SDOH) reviewed in EPIC. No concerns  3. Sexuality, contraception and birth spacing - Patient does not want a pregnancy in the next year.  Desired family size is unsure number of children.  - Reviewed reproductive life planning. Reviewed options based on patient desire and reproductive life plan. Patient is interested in Hormonal Injection. This was provided to the patient today.   Risks, benefits, and typical effectiveness rates were reviewed.  Questions were answered.  Written information was also given to  the patient to review.    The patient will follow up in  3 months for surveillance.  The patient was told to call with any further questions, or with any concerns about this method of contraception.  Emphasized use of condoms 100% of the time for STI prevention.   - Discussed birth spacing of 18 months  4. Sleep and fatigue -Encouraged  family/partner/community support of 4 hrs of uninterrupted sleep to help with mood and fatigue  5. Physical Recovery  - Discussed patients delivery and complications. She describes her labor as mixed. - Patient had a C-section emergent. - Patient has urinary incontinence? No. - Patient is safe to resume physical and sexual activity  6.  Health Maintenance - HM due items addressed Yes - Last pap smear No results found for: "DIAGPAP" Pap smear done at today's visit. This is patient's first Pap. -Breast Cancer screening indicated? No.   7. Chronic Disease/Pregnancy Condition follow up: None  - PCP follow up  Landry Dyke, PA-C [facility] [employer], [facility]

## 2022-02-11 NOTE — Progress Notes (Signed)
Wet prep and hgb reviewed - no intervention required per standing order. Client tolerated Depo without complaint. Jossie Ng, RN

## 2022-02-17 LAB — PAP IG (IMAGE GUIDED): PAP Smear Comment: 0

## 2022-07-26 ENCOUNTER — Other Ambulatory Visit: Payer: Self-pay

## 2022-07-26 ENCOUNTER — Emergency Department: Payer: Medicaid Other

## 2022-07-26 ENCOUNTER — Emergency Department
Admission: EM | Admit: 2022-07-26 | Discharge: 2022-07-26 | Disposition: A | Discharge status: Home / Self Care | Payer: Medicaid Other | Attending: Emergency Medicine | Admitting: Emergency Medicine

## 2022-07-26 DIAGNOSIS — R102 Pelvic and perineal pain: Secondary | ICD-10-CM

## 2022-07-26 DIAGNOSIS — R1032 Left lower quadrant pain: Secondary | ICD-10-CM | POA: Diagnosis present

## 2022-07-26 DIAGNOSIS — E871 Hypo-osmolality and hyponatremia: Secondary | ICD-10-CM | POA: Insufficient documentation

## 2022-07-26 DIAGNOSIS — I10 Essential (primary) hypertension: Secondary | ICD-10-CM | POA: Insufficient documentation

## 2022-07-26 LAB — COMPREHENSIVE METABOLIC PANEL
ALT: 18 U/L (ref 0–44)
AST: 22 U/L (ref 15–41)
Albumin: 3.9 g/dL (ref 3.5–5.0)
Alkaline Phosphatase: 74 U/L (ref 38–126)
Anion gap: 10 (ref 5–15)
BUN: 16 mg/dL (ref 6–20)
CO2: 18 mmol/L — ABNORMAL LOW (ref 22–32)
Calcium: 8.9 mg/dL (ref 8.9–10.3)
Chloride: 103 mmol/L (ref 98–111)
Creatinine, Ser: 0.7 mg/dL (ref 0.44–1.00)
GFR, Estimated: >60 mL/min (ref >60)
Glucose, Bld: 93 mg/dL (ref 70–99)
Potassium: 4.3 mmol/L (ref 3.5–5.1)
Sodium: 131 mmol/L — ABNORMAL LOW (ref 135–145)
Total Bilirubin: 0.7 mg/dL (ref 0.3–1.2)
Total Protein: 8.5 g/dL — ABNORMAL HIGH (ref 6.5–8.1)

## 2022-07-26 LAB — CBC WITH DIFFERENTIAL/PLATELET
Abs Immature Granulocytes: 0.01 10*3/uL (ref 0.00–0.07)
Basophils Absolute: 0 10*3/uL (ref 0.0–0.1)
Basophils Relative: 0 %
Eosinophils Absolute: 0 10*3/uL (ref 0.0–0.5)
Eosinophils Relative: 1 %
HCT: 38.7 % (ref 36.0–46.0)
Hemoglobin: 11.6 g/dL — ABNORMAL LOW (ref 12.0–15.0)
Immature Granulocytes: 0 %
Lymphocytes Relative: 49 %
Lymphs Abs: 2.8 10*3/uL (ref 0.7–4.0)
MCH: 21.3 pg — ABNORMAL LOW (ref 26.0–34.0)
MCHC: 30 g/dL (ref 30.0–36.0)
MCV: 71 fL — ABNORMAL LOW (ref 80.0–100.0)
Monocytes Absolute: 0.5 10*3/uL (ref 0.1–1.0)
Monocytes Relative: 8 %
Neutro Abs: 2.5 10*3/uL (ref 1.7–7.7)
Neutrophils Relative %: 42 %
Platelets: 252 10*3/uL (ref 150–400)
RBC: 5.45 MIL/uL — ABNORMAL HIGH (ref 3.87–5.11)
RDW: 16 % — ABNORMAL HIGH (ref 11.5–15.5)
WBC: 5.8 10*3/uL (ref 4.0–10.5)
nRBC: 0 % (ref 0.0–0.2)

## 2022-07-26 LAB — URINALYSIS, ROUTINE W REFLEX MICROSCOPIC
Bilirubin Urine: NEGATIVE
Glucose, UA: NEGATIVE mg/dL
Hgb urine dipstick: NEGATIVE
Ketones, ur: NEGATIVE mg/dL
Leukocytes,Ua: NEGATIVE
Nitrite: NEGATIVE
Protein, ur: NEGATIVE mg/dL
Specific Gravity, Urine: 1.026 (ref 1.005–1.030)
pH: 5 (ref 5.0–8.0)

## 2022-07-26 LAB — LIPASE, BLOOD: Lipase: 37 U/L (ref 11–51)

## 2022-07-26 LAB — POC URINE PREG, ED: Preg Test, Ur: NEGATIVE

## 2022-07-26 MED ORDER — NAPROXEN 500 MG PO TABS: 500.0000 mg | ORAL_TABLET | Freq: Two times a day (BID) | ORAL | 11 refills | Status: DC

## 2022-07-26 NOTE — Discharge Instructions (Addendum)
-  We are unsure exactly what is causing your abdominal pain, however it does not appear to be serious or life-threatening.  Please follow-up with your primary care provider as needed.  You may take the naproxen as needed for pain.  -Return to the emergency department anytime if you begin to experience any new or worsening symptoms.

## 2022-07-26 NOTE — ED Provider Notes (Signed)
[facility] Provider Note    Event Date/Time   First MD Initiated Contact with Patient 07/26/22 1700     (approximate)   History   Chief Complaint Abdominal Pain   HPI Denise [name] is a 22 y.o. female, history of ADHD, PTSD, conduct disorder, morbid obesity, hypertension, presents to the emergency department for evaluation of left lower quadrant pain x 1 week.  She states that she feels a intermittent sharp sensation in her left lower abdominal quadrant.  She does report a history of ovarian cyst.  Denies any recent falls or injuries.  Reports normal bowel movements.  Denies fever/chills, chest pain, shortness of breath, urinary symptoms, nausea/vomiting, dizziness/lightheadedness, rash/lesions, vaginal bleeding, vaginal discharge, weakness, or headache.  History Limitations: No limitations.        Physical Exam  Triage Vital Signs: ED Triage Vitals  Enc Vitals Group     BP 07/26/22 1718 122/67     Pulse Rate 07/26/22 1718 100     Resp 07/26/22 1718 18     Temp 07/26/22 1718 98.8 F (37.1 C)     Temp Source 07/26/22 1718 Oral     SpO2 07/26/22 1718 99 %     Weight 07/26/22 1728 230 lb (104.3 kg)     Height 07/26/22 1728 5\' 1"  (1.549 m)     Head Circumference --      Peak Flow --      Pain Score 07/26/22 1728 6     Pain Loc --      Pain Edu? --      Excl. in [place]? --     Most recent vital signs: Vitals:   07/26/22 1735 07/26/22 2047  BP:  (!) 140/89  Pulse:  97  Resp:  18  Temp: 100 F (37.8 C) 99.9 F (37.7 C)  SpO2:  99%    General: Awake, NAD.  Skin: Warm, dry. No rashes or lesions.  Eyes: PERRL. Conjunctivae normal.  CV: Good peripheral perfusion.  Resp: Normal effort.  Abd: Soft, non-tender. No distention.  Neuro: At baseline. No gross neurological deficits.  Musculoskeletal: Normal ROM of all extremities.   Physical Exam    ED Results / Procedures / Treatments  Labs (all labs ordered are listed, but only  abnormal results are displayed) Labs Reviewed  CBC WITH DIFFERENTIAL/PLATELET - Abnormal; Notable for the following components:      Result Value   RBC 5.45 (*)    Hemoglobin 11.6 (*)    MCV 71.0 (*)    MCH 21.3 (*)    RDW 16.0 (*)    All other components within normal limits  COMPREHENSIVE METABOLIC PANEL - Abnormal; Notable for the following components:   Sodium 131 (*)    CO2 18 (*)    Total Protein 8.5 (*)    All other components within normal limits  URINALYSIS, ROUTINE W REFLEX MICROSCOPIC - Abnormal; Notable for the following components:   Color, Urine YELLOW (*)    APPearance CLEAR (*)    All other components within normal limits  LIPASE, BLOOD  POC URINE PREG, ED     EKG N/A.    RADIOLOGY  ED Provider Interpretation: I personally reviewed and interpreted this ultrasound, no evidence of acute abnormalities.  US PELVIC DOPPLER (TORSION R/O OR MASS ARTERIAL FLOW)  Result Date: 07/26/2022 CLINICAL DATA:  Ten day history of left pelvic pain EXAM: TRANSABDOMINAL ULTRASOUND OF PELVIS DOPPLER ULTRASOUND OF OVARIES TECHNIQUE: Transabdominal ultrasound examination of the pelvis  was performed including evaluation of the uterus, ovaries, adnexal regions, and pelvic cul-de-sac. Transvaginal examination was declined. Color and duplex Doppler ultrasound was utilized to evaluate blood flow to the ovaries. COMPARISON:  None Available. FINDINGS: Uterus Measurements: 9.2 cm in sagittal dimension. No fibroids or other mass visualized. Endometrium Thickness: 10 mm.  No focal abnormality visualized. Right ovary Measurements: 4.0 x 2.4 x 2.3 cm = volume: 11.3 mL. Normal appearance. No adnexal mass. Left ovary Measurements: 4.3 x 2.9 x 2.3 cm = volume: 15.2 mL. Normal appearance. No adnexal mass. Pulsed Doppler evaluation demonstrates normal low-resistance arterial and venous waveforms in both ovaries. Other: No abnormal free fluid. IMPRESSION: Normal pelvic ultrasound examination. Electronically  Signed   By: Darrin Nipper M.D.   On: 07/26/2022 19:51    PROCEDURES:  Critical Care performed: N/A.  Procedures    MEDICATIONS ORDERED IN ED: Medications - No data to display   IMPRESSION / MDM / [place] / ED COURSE  I reviewed the triage vital signs and the nursing notes.                              Differential diagnosis includes, but is not limited to, cystitis, diverticulitis, ovarian cyst, ovarian torsion, ectopic pregnancy, pelvic strain, appendicitis.  ED Course Patient appears well, vitals within normal limits for the patient.  NAD.  Not requesting any pain or nausea medicine at this time.  CBC shows no leukocytosis.  Hemoglobin 11.6, consistent with her baseline.  CMP shows mild hyponatremia 131, otherwise no significant electrolyte abnormalities, AKI, or transaminitis.  Urinalysis shows no evidence of infection.  Point-of-care urine pregnancy negative.  Assessment/Plan Patient presents for concern of intermittent left lower quadrant abdominal pain x 1 week.  She appears well clinically.  Physical exam not suggestive of any acute surgical abdominal pathology.  Her lab workup is reassuring.  Ultrasound does not show any acute findings.  Spoke to her about possible CT imaging for further evaluation, however patient has declined.  Unclear etiology of the source of her abdominal pain, however very low suspicion for any serious or life-threatening pathology at this time.  Will provide her with a prescription for naproxen to manage her pain.  Encouraged her to follow-up with her primary care provider if her symptoms fail to improve.  She was agreeable to this.  Will discharge.  Considered admission for this patient, but given her stable presentation and unremarkable findings, she is unlikely benefit from admission.  Provided the patient with anticipatory guidance, return precautions, and educational material. Encouraged the patient to return to the emergency department  at any time if they begin to experience any new or worsening symptoms. Patient expressed understanding and agreed with the plan.   Patient's presentation is most consistent with acute complicated illness / injury requiring diagnostic workup.       FINAL CLINICAL IMPRESSION(S) / ED DIAGNOSES   Final diagnoses:  Left lower quadrant abdominal pain     Rx / DC Orders   ED Discharge Orders          Ordered    naproxen (NAPROSYN) 500 MG tablet  2 times daily with meals        07/26/22 2033             Note:  This document was prepared using Dragon voice recognition software and may include unintentional dictation errors.   Teodoro Spray, [place] 07/26/22 [phone]  Naaman Plummer, MD 07/27/22 [phone]

## 2022-07-26 NOTE — ED Triage Notes (Signed)
Pt c/o left lower quadrant pain x 1 week. Pt describes pain as a constant sharp feeling. Last BM was today and was normal for her. She denies diarrhea, nausea, vomiting and fever. Last menstrual cycle was last month on 1/2.

## 2022-07-27 ENCOUNTER — Other Ambulatory Visit: Payer: Self-pay | Admitting: Emergency Medicine

## 2022-07-27 DIAGNOSIS — R102 Pelvic and perineal pain: Secondary | ICD-10-CM

## 2022-08-02 ENCOUNTER — Ambulatory Visit: Payer: Medicaid Other

## 2022-08-26 ENCOUNTER — Emergency Department
Admission: EM | Admit: 2022-08-26 | Discharge: 2022-08-27 | Disposition: A | Discharge status: Home / Self Care | Payer: Medicaid Other | Attending: Emergency Medicine | Admitting: Emergency Medicine

## 2022-08-26 ENCOUNTER — Emergency Department: Payer: Medicaid Other

## 2022-08-26 DIAGNOSIS — O209 Hemorrhage in early pregnancy, unspecified: Secondary | ICD-10-CM | POA: Insufficient documentation

## 2022-08-26 DIAGNOSIS — O4690 Antepartum hemorrhage, unspecified, unspecified trimester: Secondary | ICD-10-CM

## 2022-08-26 DIAGNOSIS — Z3A08 8 weeks gestation of pregnancy: Secondary | ICD-10-CM | POA: Diagnosis not present

## 2022-08-26 LAB — CBC
HCT: 36.5 % (ref 36.0–46.0)
Hemoglobin: 11.3 g/dL — ABNORMAL LOW (ref 12.0–15.0)
MCH: 21.9 pg — ABNORMAL LOW (ref 26.0–34.0)
MCHC: 31 g/dL (ref 30.0–36.0)
MCV: 70.7 fL — ABNORMAL LOW (ref 80.0–100.0)
Platelets: 270 10*3/uL (ref 150–400)
RBC: 5.16 MIL/uL — ABNORMAL HIGH (ref 3.87–5.11)
RDW: 15.9 % — ABNORMAL HIGH (ref 11.5–15.5)
WBC: 6 10*3/uL (ref 4.0–10.5)
nRBC: 0 % (ref 0.0–0.2)

## 2022-08-26 LAB — URINALYSIS, ROUTINE W REFLEX MICROSCOPIC
Bacteria, UA: NONE SEEN
Bilirubin Urine: NEGATIVE
Glucose, UA: NEGATIVE mg/dL
Ketones, ur: NEGATIVE mg/dL
Leukocytes,Ua: NEGATIVE
Nitrite: NEGATIVE
Protein, ur: NEGATIVE mg/dL
Specific Gravity, Urine: 1.019 (ref 1.005–1.030)
pH: 6 (ref 5.0–8.0)

## 2022-08-26 LAB — POC URINE PREG, ED: Preg Test, Ur: POSITIVE — AB

## 2022-08-26 NOTE — ED Notes (Signed)
Pt playing on phone while in triage. RN asking the same question two or three times due to pt not paying attention.

## 2022-08-26 NOTE — ED Triage Notes (Signed)
Pt sts that she is pregnant and is having vaginal bleeding. Pt LMP was on 06/27/2022 and her EDD is 04/03/2023. Pt sts that she has been having abd cramping with the vaginal bleeding.

## 2022-08-27 LAB — HCG, QUANTITATIVE, PREGNANCY: hCG, Beta Chain, Quant, S: 81526 m[IU]/mL — ABNORMAL HIGH (ref <5)

## 2022-08-27 NOTE — Discharge Instructions (Addendum)
You have been seen in the Emergency Department (ED) for vaginal bleeding during pregnancy, which is called a "threatened miscarriage" or "threatened abortion".  However, the blood work and ultrasound cannot say for sure that this pregnancy is going to develop into a normal fetus.  It may be too early to know for sure, or it may be that the pregnancy is not going to develop.  You need to follow-up as an outpatient for a repeat blood test called a beta hCG and a repeat ultrasound in order to determine if this is a pregnancy that will never develop, a miscarriage, or a fetus that will develop normally.  As a result of your blood type, you did not receive an injection of medication called Rhogam - please let your OB/Gyn know.  Please follow up as recommended above.  If you develop any other symptoms that concern you (including, but not limited to, persistent vomiting, worsening bleeding, abdominal or pelvic pain, or fever greater than 101), please return immediately to the Emergency Department.

## 2022-08-27 NOTE — ED Provider Notes (Signed)
[facility] Provider Note    Event Date/Time   First MD Initiated Contact with Patient 08/26/22 2336     (approximate)   History   Vaginal Bleeding   HPI  Denise [name] is a 22 y.o. female G2, P1 at approximately [redacted] weeks gestation.  She presents for evaluation of some vaginal bleeding.  She said that it started around 4 PM when she passed some blood clots and had some bright red blood when she urinated.  She said that it happened a couple more times during the day but it got less to her the urine was just a little bit pink but then she had another couple of clots when she urinated here at the hospital.  She denies pain.  She denies nausea vomiting and diarrhea.  No recent fever.  No dysuria.  No recent trauma.  She goes to [facility] for prenatal care.  Last menstrual period was at the beginning of January of this year.     Physical Exam   Triage Vital Signs: ED Triage Vitals [08/26/22 1756]  Enc Vitals Group     BP (!) 143/98     Pulse Rate 98     Resp 17     Temp 98.1 F (36.7 C)     Temp Source Oral     SpO2 99 %     Weight 99.8 kg (220 lb)     Height      Head Circumference      Peak Flow      Pain Score 1     Pain Loc      Pain Edu?      Excl. in [place]?     Most recent vital signs: Vitals:   08/26/22 1756  BP: (!) 143/98  Pulse: 98  Resp: 17  Temp: 98.1 F (36.7 C)  SpO2: 99%     General: Awake, no distress.  CV:  Good peripheral perfusion.  Regular rate and rhythm. Resp:  Normal effort. Speaking easily and comfortably, no accessory muscle usage nor intercostal retractions.   Abd:  No distention.  No tenderness to palpation of the abdomen. Other:  Is ambulatory with no difficulty or distress.  Quiet, somewhat blunted mood and affect initially but does smile and interact with me appropriately after a short period of time into our conversation.   ED Results / Procedures / Treatments   Labs (all labs ordered are  listed, but only abnormal results are displayed) Labs Reviewed  CBC - Abnormal; Notable for the following components:      Result Value   RBC 5.16 (*)    Hemoglobin 11.3 (*)    MCV 70.7 (*)    MCH 21.9 (*)    RDW 15.9 (*)    All other components within normal limits  URINALYSIS, ROUTINE W REFLEX MICROSCOPIC - Abnormal; Notable for the following components:   Color, Urine YELLOW (*)    APPearance HAZY (*)    Hgb urine dipstick LARGE (*)    All other components within normal limits  POC URINE PREG, ED - Abnormal; Notable for the following components:   Preg Test, Ur Positive (*)    All other components within normal limits  HCG, QUANTITATIVE, PREGNANCY     RADIOLOGY I viewed and interpreted the patient's ultrasound.  I can see cardiac activity.  The radiologist mentioned no subchorionic hemorrhage and about 8 weeks and 1 day gestation.  PROCEDURES:  Critical Care performed: No  Procedures   MEDICATIONS ORDERED IN ED: Medications - No data to display   IMPRESSION / MDM / [place] / ED COURSE  I reviewed the triage vital signs and the nursing notes.                              Differential diagnosis includes, but is not limited to, threatened miscarriage, incomplete miscarriage, normal bleeding from an early trimester pregnancy, ectopic pregnancy, , blighted ovum, vaginal/cervical trauma, subchorionic hemorrhage/hematoma, etc.   Patient's presentation is most consistent with acute presentation with potential threat to life or bodily function.  Labs/studies ordered: CBC, urine pregnancy test, urinalysis, OB ultrasound  Vital signs stable within normal limits.  Patient is not having any pain and has no tenderness to palpation.  No indication for pelvic exam; deferred until she follows up with her prenatal provider.  hCG had not been processed when I saw her, but I ordered it so that we would have a baseline in the system for follow-up purposes.  The ultrasound,  however, is reassuring.  I had my usual and customary threatened miscarriage discussion with the patient.  I looked in the medical record and verified that in the past including most recently on December 24, 2021, she had an ABO Rh test which demonstrated a positive blood, indicating that she does not need RhoGAM.  She understands and agrees with the plan for outpatient follow-up and will call [place] [facility] to schedule follow-up appointment.     FINAL CLINICAL IMPRESSION(S) / ED DIAGNOSES   Final diagnoses:  Vaginal bleeding in pregnancy     Rx / DC Orders   ED Discharge Orders     None        Note:  This document was prepared using Dragon voice recognition software and may include unintentional dictation errors.   Hinda Kehr, MD 08/27/22 0005

## 2022-08-27 NOTE — ED Provider Notes (Incomplete)
[facility] Provider Note    Event Date/Time   First MD Initiated Contact with Patient 08/26/22 2336     (approximate)   History   Vaginal Bleeding   HPI  Denise [name] is a 22 y.o. female G2, P1 at approximately [redacted] weeks gestation.  She presents for evaluation of some vaginal bleeding.  She said that it started around 4 PM when she passed some blood clots and had some bright red blood when she urinated.  She said that it happened a couple more times during the day but it got less to her the urine was just a little bit pink but then she had another couple of clots when she urinated here at the hospital.  She denies pain.  She denies nausea vomiting and diarrhea.  No recent fever.  No dysuria.  No recent trauma.  She goes to [facility] for prenatal care.  Last menstrual period was at the beginning of January of this year.     Physical Exam   Triage Vital Signs: ED Triage Vitals [08/26/22 1756]  Enc Vitals Group     BP (!) 143/98     Pulse Rate 98     Resp 17     Temp 98.1 F (36.7 C)     Temp Source Oral     SpO2 99 %     Weight 99.8 kg (220 lb)     Height      Head Circumference      Peak Flow      Pain Score 1     Pain Loc      Pain Edu?      Excl. in [place]?     Most recent vital signs: Vitals:   08/26/22 1756  BP: (!) 143/98  Pulse: 98  Resp: 17  Temp: 98.1 F (36.7 C)  SpO2: 99%     General: Awake, no distress.  CV:  Good peripheral perfusion.  Regular rate and rhythm. Resp:  Normal effort. Speaking easily and comfortably, no accessory muscle usage nor intercostal retractions.   Abd:  No distention.  No tenderness to palpation of the abdomen. Other:  Is ambulatory with no difficulty or distress.  Quiet, somewhat blunted mood and affect initially but does smile and interact with me appropriately after a short period of time into our conversation.   ED Results / Procedures / Treatments   Labs (all labs ordered are  listed, but only abnormal results are displayed) Labs Reviewed  CBC - Abnormal; Notable for the following components:      Result Value   RBC 5.16 (*)    Hemoglobin 11.3 (*)    MCV 70.7 (*)    MCH 21.9 (*)    RDW 15.9 (*)    All other components within normal limits  URINALYSIS, ROUTINE W REFLEX MICROSCOPIC - Abnormal; Notable for the following components:   Color, Urine YELLOW (*)    APPearance HAZY (*)    Hgb urine dipstick LARGE (*)    All other components within normal limits  POC URINE PREG, ED - Abnormal; Notable for the following components:   Preg Test, Ur Positive (*)    All other components within normal limits  HCG, QUANTITATIVE, PREGNANCY     RADIOLOGY I viewed and interpreted the patient's ultrasound.  I can see cardiac activity.  The radiologist mentioned no subchorionic hemorrhage and about 8 weeks and 1 day gestation.  PROCEDURES:  Critical Care performed: No  Procedures   MEDICATIONS ORDERED IN ED: Medications - No data to display   IMPRESSION / MDM / [place] / ED COURSE  I reviewed the triage vital signs and the nursing notes.                              Differential diagnosis includes, but is not limited to, threatened miscarriage, incomplete miscarriage, normal bleeding from an early trimester pregnancy, ectopic pregnancy, , blighted ovum, vaginal/cervical trauma, subchorionic hemorrhage/hematoma, etc.   Patient's presentation is most consistent with acute presentation with potential threat to life or bodily function.  Labs/studies ordered:  *** Interventions/Medications given:  *** [facility] Course my include additional interventions or labs/studies not listed above.)  ***  {**The patient is on the cardiac monitor to evaluate for evidence of arrhythmia and/or significant heart rate changes.**}       FINAL CLINICAL IMPRESSION(S) / ED DIAGNOSES   Final diagnoses:  None     Rx / DC Orders   ED Discharge Orders      None        Note:  This document was prepared using Dragon voice recognition software and may include unintentional dictation errors.

## 2022-10-15 ENCOUNTER — Ambulatory Visit: Payer: Medicaid Other

## 2022-11-17 DIAGNOSIS — O0993 Supervision of high risk pregnancy, unspecified, third trimester: Secondary | ICD-10-CM | POA: Insufficient documentation

## 2022-11-22 ENCOUNTER — Other Ambulatory Visit: Payer: Self-pay | Admitting: Obstetrics

## 2022-11-22 DIAGNOSIS — Z363 Encounter for antenatal screening for malformations: Secondary | ICD-10-CM

## 2022-11-24 ENCOUNTER — Other Ambulatory Visit: Payer: Self-pay

## 2022-12-08 ENCOUNTER — Other Ambulatory Visit: Payer: Self-pay

## 2022-12-08 ENCOUNTER — Ambulatory Visit: Payer: Medicaid Other | Attending: Obstetrics and Gynecology

## 2022-12-08 DIAGNOSIS — O99212 Obesity complicating pregnancy, second trimester: Secondary | ICD-10-CM

## 2022-12-08 DIAGNOSIS — E669 Obesity, unspecified: Secondary | ICD-10-CM

## 2022-12-08 DIAGNOSIS — O34219 Maternal care for unspecified type scar from previous cesarean delivery: Secondary | ICD-10-CM | POA: Diagnosis present

## 2022-12-08 DIAGNOSIS — Z3A23 23 weeks gestation of pregnancy: Secondary | ICD-10-CM

## 2022-12-08 DIAGNOSIS — Z363 Encounter for antenatal screening for malformations: Secondary | ICD-10-CM | POA: Insufficient documentation

## 2022-12-08 DIAGNOSIS — O09292 Supervision of pregnancy with other poor reproductive or obstetric history, second trimester: Secondary | ICD-10-CM

## 2023-01-11 ENCOUNTER — Other Ambulatory Visit: Payer: Self-pay

## 2023-01-11 DIAGNOSIS — O99213 Obesity complicating pregnancy, third trimester: Secondary | ICD-10-CM

## 2023-01-11 DIAGNOSIS — F902 Attention-deficit hyperactivity disorder, combined type: Secondary | ICD-10-CM

## 2023-01-11 DIAGNOSIS — F4310 Post-traumatic stress disorder, unspecified: Secondary | ICD-10-CM

## 2023-01-12 ENCOUNTER — Ambulatory Visit: Payer: Medicaid Other | Attending: Maternal & Fetal Medicine

## 2023-01-12 ENCOUNTER — Other Ambulatory Visit: Payer: Self-pay

## 2023-01-12 ENCOUNTER — Other Ambulatory Visit: Payer: Medicaid Other

## 2023-01-12 DIAGNOSIS — O99213 Obesity complicating pregnancy, third trimester: Secondary | ICD-10-CM | POA: Diagnosis present

## 2023-01-12 DIAGNOSIS — O99343 Other mental disorders complicating pregnancy, third trimester: Secondary | ICD-10-CM | POA: Insufficient documentation

## 2023-01-12 DIAGNOSIS — F902 Attention-deficit hyperactivity disorder, combined type: Secondary | ICD-10-CM | POA: Diagnosis not present

## 2023-01-12 DIAGNOSIS — Z3A28 28 weeks gestation of pregnancy: Secondary | ICD-10-CM | POA: Insufficient documentation

## 2023-01-12 DIAGNOSIS — O34219 Maternal care for unspecified type scar from previous cesarean delivery: Secondary | ICD-10-CM

## 2023-01-12 DIAGNOSIS — Z363 Encounter for antenatal screening for malformations: Secondary | ICD-10-CM | POA: Diagnosis not present

## 2023-01-12 DIAGNOSIS — F4310 Post-traumatic stress disorder, unspecified: Secondary | ICD-10-CM

## 2023-01-12 DIAGNOSIS — E669 Obesity, unspecified: Secondary | ICD-10-CM | POA: Diagnosis not present

## 2023-02-08 ENCOUNTER — Other Ambulatory Visit: Payer: Self-pay

## 2023-02-08 DIAGNOSIS — O99013 Anemia complicating pregnancy, third trimester: Secondary | ICD-10-CM

## 2023-02-08 DIAGNOSIS — F902 Attention-deficit hyperactivity disorder, combined type: Secondary | ICD-10-CM

## 2023-02-08 DIAGNOSIS — O99213 Obesity complicating pregnancy, third trimester: Secondary | ICD-10-CM

## 2023-02-08 DIAGNOSIS — F4310 Post-traumatic stress disorder, unspecified: Secondary | ICD-10-CM

## 2023-02-09 ENCOUNTER — Other Ambulatory Visit: Payer: Self-pay

## 2023-02-09 ENCOUNTER — Ambulatory Visit: Payer: Medicaid Other | Attending: Maternal & Fetal Medicine

## 2023-02-09 DIAGNOSIS — O34219 Maternal care for unspecified type scar from previous cesarean delivery: Secondary | ICD-10-CM | POA: Diagnosis not present

## 2023-02-09 DIAGNOSIS — E669 Obesity, unspecified: Secondary | ICD-10-CM | POA: Diagnosis not present

## 2023-02-09 DIAGNOSIS — Z3A32 32 weeks gestation of pregnancy: Secondary | ICD-10-CM | POA: Diagnosis not present

## 2023-02-09 DIAGNOSIS — O99013 Anemia complicating pregnancy, third trimester: Secondary | ICD-10-CM

## 2023-02-09 DIAGNOSIS — F4310 Post-traumatic stress disorder, unspecified: Secondary | ICD-10-CM

## 2023-02-09 DIAGNOSIS — F902 Attention-deficit hyperactivity disorder, combined type: Secondary | ICD-10-CM

## 2023-02-09 DIAGNOSIS — O99213 Obesity complicating pregnancy, third trimester: Secondary | ICD-10-CM | POA: Diagnosis not present

## 2023-02-23 ENCOUNTER — Other Ambulatory Visit: Payer: Medicaid Other

## 2023-03-02 ENCOUNTER — Ambulatory Visit: Payer: Medicaid Other | Attending: Obstetrics | Admitting: Obstetrics

## 2023-03-02 ENCOUNTER — Other Ambulatory Visit: Payer: Self-pay

## 2023-03-02 VITALS — BP 125/67 | HR 81 | Temp 98.7°F | Ht 62.0 in | Wt 269.5 lb

## 2023-03-02 DIAGNOSIS — E669 Obesity, unspecified: Secondary | ICD-10-CM | POA: Diagnosis not present

## 2023-03-02 DIAGNOSIS — O99213 Obesity complicating pregnancy, third trimester: Secondary | ICD-10-CM | POA: Diagnosis not present

## 2023-03-02 DIAGNOSIS — Z3A35 35 weeks gestation of pregnancy: Secondary | ICD-10-CM | POA: Diagnosis not present

## 2023-03-02 DIAGNOSIS — O10013 Pre-existing essential hypertension complicating pregnancy, third trimester: Secondary | ICD-10-CM

## 2023-03-02 DIAGNOSIS — O163 Unspecified maternal hypertension, third trimester: Secondary | ICD-10-CM

## 2023-03-02 NOTE — Procedures (Signed)
Denise [name] 05/22/2001 [redacted]w[redacted]d  Fetus A Non-Stress Test Interpretation for 03/02/23  Indication:  Obesity, Short Interval between Pregnancies, HTN  Fetal Heart Rate A Mode: External Baseline Rate (A): 135 bpm Variability: Moderate Accelerations: 15 x 15 Decelerations: None Multiple birth?: No  Uterine Activity Mode: Toco  Interpretation (Fetal Testing) Nonstress Test Interpretation: Reactive (Dr. Parke Poisson interpreted NST) Overall Impression: Reassuring for gestational age

## 2023-03-07 ENCOUNTER — Other Ambulatory Visit: Payer: Self-pay

## 2023-03-07 DIAGNOSIS — O09893 Supervision of other high risk pregnancies, third trimester: Secondary | ICD-10-CM

## 2023-03-07 DIAGNOSIS — O99213 Obesity complicating pregnancy, third trimester: Secondary | ICD-10-CM

## 2023-03-07 DIAGNOSIS — Z98891 History of uterine scar from previous surgery: Secondary | ICD-10-CM

## 2023-03-09 ENCOUNTER — Other Ambulatory Visit: Payer: Self-pay

## 2023-03-09 ENCOUNTER — Ambulatory Visit: Payer: Medicaid Other | Attending: Obstetrics and Gynecology

## 2023-03-09 DIAGNOSIS — Z98891 History of uterine scar from previous surgery: Secondary | ICD-10-CM

## 2023-03-09 DIAGNOSIS — Z362 Encounter for other antenatal screening follow-up: Secondary | ICD-10-CM | POA: Insufficient documentation

## 2023-03-09 DIAGNOSIS — O99213 Obesity complicating pregnancy, third trimester: Secondary | ICD-10-CM

## 2023-03-09 DIAGNOSIS — O09893 Supervision of other high risk pregnancies, third trimester: Secondary | ICD-10-CM | POA: Insufficient documentation

## 2023-03-09 DIAGNOSIS — O34219 Maternal care for unspecified type scar from previous cesarean delivery: Secondary | ICD-10-CM

## 2023-03-09 DIAGNOSIS — Z3A36 36 weeks gestation of pregnancy: Secondary | ICD-10-CM | POA: Diagnosis not present

## 2023-03-09 DIAGNOSIS — E669 Obesity, unspecified: Secondary | ICD-10-CM | POA: Diagnosis not present

## 2023-03-14 ENCOUNTER — Ambulatory Visit: Payer: Medicaid Other

## 2023-03-17 NOTE — H&P (Signed)
Denise [name] is a 22 y.o. female presenting for elective repeat LTCS and BTL on 03/30/23 st 39+0 weeks   EDC 04/06/23 based on 8+2 week U/S .22 y.o. G2P1  consistent with  with ultrasound @ [redacted]w[redacted]d on 08/26/22 .Estimated Date of Delivery: 04/06/2023 Sex of baby and name:  " "   Partner:     Factors complicating this pregnancy  BMI 40 or more - Obesity Class III  BMI at NOB _47__ Baseline labs:  Early 1 hour GTT: 103    A1c:5.8 P/C ratio:66   ZHY:QMVHQI normal limits      TSH: 1.149 MFM referral sent: Ordered at New OB visit on 11/19/22 (asap ref for anat scan) Scheduled for growth at 28 weeks with MFM  Done 02/09/23 They will repeat in one month Antepartum management  Weekly NST/AFI starting at 34 weeks at [facility] per MFM [place] for growth every month in 3rd trimester Mfm will be doing monthly First was done 02/09/23 Baby ASA started at 12wks   Previous C/S x_1_ Primary LTCS by TJS , elects for repeat by TJS 03/30/23 ( 39+0) + BTL  Indication: NRFHR Delivery preference:__x_  Anemia  7/29 - hgb 9.9 - started on BID iron supplements  03/01/23: Hgb 10.4, ferritin 27  Elevated 1hr  1hr GTT: 138  3hr GCT: 82, 147, 100, 110  H/o mental health diagnoses: _PTSD and ADHD Medications prior to pregnancy: Medications during pregnancy: Counseling:   Screening results and needs: NOB:  Medicaid Questionnaire: Completed 11/19/22 [x]  ACHD Program Depression Score: 0 MBT: A Positive  Ab screen:  Neg  HIV: Neg  RPR: NR   Hep B:Neg  Hep C:NR  Pap: G/C:  Rubella:Immune    VZV: Immune TSH:1.149  HgA1c: 5.8 Aneuploidy:  First trimester:  MaternitT21:Negative   Second trimester (AFP/tetra): Negative 28 weeks:  Review Medicaid Questionnaire: []  ACHD Program no changes Depression Score: 0 Blood consent: signed AC 01/17/23 Hgb: 9.9   Platelets: 251   Glucola: 138  Rhogam: N/A 36 weeks:  ONG:EXBMWUXL   G/C:Neg/neg   Hgb:10.8  Platelets:202    HIV:neg RPR: NR   Last [place]:  08/26/22:  ( poor LMP ) dating  based on this -[place] Live IUP YS visualized. FHT=189 BPM. CRl 16.5 mm = [redacted]w[redacted]d  Done at [facility] 12/08/22: cephalic, ant plac, EFW-543 g @ 37%, AFI-WNL 01/12/23-cephalic, ant plac, EFW-1124 g @29 %, AFI-WITHIN NORMAL LIMITS 03/15/23: AFI 10.4 cm within normal limits. Pres=Ceph Plac=Anterior FHT= 142 BPM. Immunization:   Flu in season -  Tdap at 27-36 weeks - given [facility] 01/17/23 RSV at 32-36 weeks -  Contraception Plan: SIGNED BTL 01/17/23/ considering IUD Feeding Plan: Formula Labor Plans:C-section      OB History     Gravida  2   Para  1   Term  1   Preterm      AB      Living  1      SAB      IAB      Ectopic      Multiple  0   Live Births  1          Past Medical History:  Diagnosis Date   ADHD    Anemia affecting pregnancy in third trimester    History of gestational hypertension    2023 pregnancy   Premature baby    a.) patient born at 46 weeks   Premature puberty    a.) maternal menarche was at 22 years of age  PTSD (post-traumatic stress disorder)    Tachycardia    Past Surgical History:  Procedure Laterality Date   CESAREAN SECTION  12/25/2021   Procedure: CESAREAN SECTION;  Surgeon: Feliberto Gottron, Ihor Austin, MD;  Location: [facility] ORS;  Service: Obstetrics;;   WISDOM TOOTH EXTRACTION Bilateral    22 years old   Family History: family history includes Bipolar disorder in her mother; Cerebral palsy in her half-brother; Diabetes in her maternal grandmother and mother; Enuresis in her half-sister; Hypertension in her maternal grandmother. Social History:  reports that she has never smoked. She has been exposed to tobacco smoke. She has never used smokeless tobacco. She reports that she does not currently use alcohol after a past usage of about 2.0 standard drinks of alcohol per week. She reports that she does not use drugs.      Review of Systems History   Last menstrual period 06/27/2022, not currently breastfeeding. Exam BP 124/82   Pulse 74   Ht  156.2 cm (5' 1.5")   Wt (!) 122.9 kg (271 lb)   LMP  (LMP Unknown)   BMI 50.38 kg/m   Physical Exam  Lungs CTA cv rrr   Abd gravid  Prenatal labs: ABO, Rh:  A+ Antibody:  neg  Rubella:  Imm , vz  Imm RPR:   NR HBsAg:   neg HIV:   neg GBS:   neg   Assessment/Plan: Elective repeat LTCS on 03/30/23   Desires BTL . Medicaid sheets signed 01/17/23   Ihor Austin Tashe Purdon 03/17/2023, 4:33 PM

## 2023-03-24 ENCOUNTER — Encounter
Admission: RE | Admit: 2023-03-24 | Discharge: 2023-03-24 | Disposition: A | Discharge status: Home / Self Care | Payer: Medicaid Other | Source: Ambulatory Visit | Attending: Obstetrics and Gynecology | Admitting: Obstetrics and Gynecology

## 2023-03-24 ENCOUNTER — Other Ambulatory Visit: Payer: Self-pay

## 2023-03-24 NOTE — Patient Instructions (Addendum)
Your procedure is scheduled on: Wednesday 03/30/23    Report to the Birthplace. Arrival time is 5:30 am to the Emergency Department registration desk. Birthplace for any questions or regarding visitation call [phone]  REMEMBER: Instructions that are not followed completely may result in serious medical risk, up to and including death; or upon the discretion of your surgeon and anesthesiologist your surgery may need to be rescheduled.  Do not eat food or drink any liquids after midnight the night before surgery.  No gum chewing or hard candies.  Continue taking all prescribed medications.   TAKE ONLY THESE MEDICATIONS THE MORNING OF SURGERY WITH A SIP OF WATER:  none  No Alcohol for 24 hours before or after surgery.  No Smoking including e-cigarettes for 24 hours before surgery.  No chewable tobacco products for at least 6 hours before surgery.  No nicotine patches on the day of surgery.  Do not use any "recreational" drugs for at least a week (preferably 2 weeks) before your surgery.  Please be advised that the combination of cocaine and anesthesia may have negative outcomes, up to and including death. If you test positive for cocaine, your surgery will be cancelled.  On the morning of surgery brush your teeth with toothpaste and water, you may rinse your mouth with mouthwash if you wish. Do not swallow any toothpaste or mouthwash.  Use CHG Soap or wipes as directed on instruction sheet.  Do not wear lotions, powders, or perfumes. You may use deodorant.  Do not shave body hair from the neck down 48 hours before surgery.  Wear comfortable clothing (specific to your surgery type) to the hospital.  Do not wear jewelry, make-up, hairpins, clips or nail polish.  For welded (permanent) jewelry: bracelets, anklets, waist bands, etc.  Please have this removed prior to surgery.  If it is not removed, there is a chance that hospital personnel will need to cut it off on the day of  surgery. Contact lenses, hearing aids and dentures may not be worn into surgery.  Do not bring valuables to the hospital. [facility] is not responsible for any missing/lost belongings or valuables.   Notify your doctor if there is any change in your medical condition (cold, fever, infection).  If you are being discharged the day of surgery, you will not be allowed to drive home. You will need a responsible individual to drive you home and stay with you for 24 hours after surgery.   If you are taking public transportation, you will need to have a responsible individual with you.  If you are being admitted to the hospital overnight, leave your suitcase in the car. After surgery it may be brought to your room.  In case of increased patient census, it may be necessary for you, the patient, to continue your postoperative care in the Same Day Surgery department.  After surgery, you can help prevent lung complications by doing breathing exercises.  Take deep breaths and cough every 1-2 hours. Your doctor may order a device called an Incentive Spirometer to help you take deep breaths. When coughing or sneezing, hold a pillow firmly against your incision with both hands. This is called "splinting." Doing this helps protect your incision. It also decreases belly discomfort.  Surgery Visitation Policy:  Patients undergoing a surgery or procedure may have two family members or support persons with them as long as the person is not COVID-19 positive or experiencing its symptoms.   Inpatient Visitation:    Visiting  hours are 7 a.m. to 8 p.m. Up to four visitors are allowed at one time in a patient room. The visitors may rotate out with other people during the day. One designated support person (adult) may remain overnight.  Please call the Pre-admissions Testing Dept. at [phone] if you have any questions about these instructions.     Preparing for Surgery with CHLORHEXIDINE GLUCONATE  (CHG) Soap  Chlorhexidine Gluconate (CHG) Soap  o An antiseptic cleaner that kills germs and bonds with the skin to continue killing germs even after washing  o Used for showering the night before surgery and morning of surgery  Before surgery, you can play an important role by reducing the number of germs on your skin.  CHG (Chlorhexidine gluconate) soap is an antiseptic cleanser which kills germs and bonds with the skin to continue killing germs even after washing.  Please do not use if you have an allergy to CHG or antibacterial soaps. If your skin becomes reddened/irritated stop using the CHG.  1. Shower the NIGHT BEFORE SURGERY and the MORNING OF SURGERY with CHG soap.  2. If you choose to wash your hair, wash your hair first as usual with your normal shampoo.  3. After shampooing, rinse your hair and body thoroughly to remove the shampoo.  4. Use CHG as you would any other liquid soap. You can apply CHG directly to the skin and wash gently with a scrungie or a clean washcloth.  5. Apply the CHG soap to your body only from the neck down. Do not use on open wounds or open sores. Avoid contact with your eyes, ears, mouth, and genitals (private parts). Wash face and genitals (private parts) with your normal soap.  6. Wash thoroughly, paying special attention to the area where your surgery will be performed.  7. Thoroughly rinse your body with warm water.  8. Do not shower/wash with your normal soap after using and rinsing off the CHG soap.  9. Pat yourself dry with a clean towel.  10. Wear clean pajamas to bed the night before surgery.  12. Place clean sheets on your bed the night of your first shower and do not sleep with pets.  13. Shower again with the CHG soap on the day of surgery prior to arriving at the hospital.  14. Do not apply any deodorants/lotions/powders.  15. Please wear clean clothes to the hospital.

## 2023-03-28 ENCOUNTER — Encounter: Payer: Self-pay | Admitting: Urgent Care

## 2023-03-28 ENCOUNTER — Encounter
Admission: RE | Admit: 2023-03-28 | Discharge: 2023-03-28 | Disposition: A | Discharge status: Home / Self Care | Payer: Medicaid Other | Source: Ambulatory Visit | Attending: Obstetrics and Gynecology | Admitting: Obstetrics and Gynecology

## 2023-03-28 DIAGNOSIS — Z01812 Encounter for preprocedural laboratory examination: Secondary | ICD-10-CM | POA: Insufficient documentation

## 2023-03-28 LAB — TYPE AND SCREEN
ABO/RH(D): A POS
Antibody Screen: NEGATIVE
Extend sample reason: UNDETERMINED

## 2023-03-28 LAB — CBC
HCT: 37 % (ref 36.0–46.0)
Hemoglobin: 11.6 g/dL — ABNORMAL LOW (ref 12.0–15.0)
MCH: 22.5 pg — ABNORMAL LOW (ref 26.0–34.0)
MCHC: 31.4 g/dL (ref 30.0–36.0)
MCV: 71.8 fL — ABNORMAL LOW (ref 80.0–100.0)
Platelets: 181 10*3/uL (ref 150–400)
RBC: 5.15 MIL/uL — ABNORMAL HIGH (ref 3.87–5.11)
RDW: 15.8 % — ABNORMAL HIGH (ref 11.5–15.5)
WBC: 5 10*3/uL (ref 4.0–10.5)
nRBC: 0 % (ref 0.0–0.2)

## 2023-03-28 LAB — BASIC METABOLIC PANEL
Anion gap: 8 (ref 5–15)
BUN: 11 mg/dL (ref 6–20)
CO2: 21 mmol/L — ABNORMAL LOW (ref 22–32)
Calcium: 8.8 mg/dL — ABNORMAL LOW (ref 8.9–10.3)
Chloride: 107 mmol/L (ref 98–111)
Creatinine, Ser: 0.74 mg/dL (ref 0.44–1.00)
GFR, Estimated: >60 mL/min (ref >60)
Glucose, Bld: 137 mg/dL — ABNORMAL HIGH (ref 70–99)
Potassium: 3.4 mmol/L — ABNORMAL LOW (ref 3.5–5.1)
Sodium: 136 mmol/L (ref 135–145)

## 2023-03-29 ENCOUNTER — Encounter
Admission: EM | Disposition: A | Discharge status: Home / Self Care | Payer: Self-pay | Source: Home / Self Care | Attending: Obstetrics and Gynecology

## 2023-03-29 ENCOUNTER — Encounter: Payer: Self-pay | Admitting: Obstetrics and Gynecology

## 2023-03-29 ENCOUNTER — Inpatient Hospital Stay
Admission: RE | Admit: 2023-03-29 | Payer: Medicaid Other | Source: Ambulatory Visit | Admitting: Obstetrics and Gynecology

## 2023-03-29 ENCOUNTER — Inpatient Hospital Stay: Payer: Medicaid Other | Admitting: Certified Registered"

## 2023-03-29 ENCOUNTER — Other Ambulatory Visit: Payer: Self-pay

## 2023-03-29 ENCOUNTER — Inpatient Hospital Stay
Admission: EM | Admit: 2023-03-29 | Discharge: 2023-03-31 | DRG: 787 | Disposition: A | Discharge status: Home / Self Care | Payer: Medicaid Other

## 2023-03-29 ENCOUNTER — Observation Stay: Payer: Medicaid Other

## 2023-03-29 DIAGNOSIS — Z8249 Family history of ischemic heart disease and other diseases of the circulatory system: Secondary | ICD-10-CM

## 2023-03-29 DIAGNOSIS — Z9889 Other specified postprocedural states: Secondary | ICD-10-CM

## 2023-03-29 DIAGNOSIS — O9081 Anemia of the puerperium: Secondary | ICD-10-CM | POA: Diagnosis not present

## 2023-03-29 DIAGNOSIS — O34219 Maternal care for unspecified type scar from previous cesarean delivery: Secondary | ICD-10-CM | POA: Diagnosis present

## 2023-03-29 DIAGNOSIS — Z818 Family history of other mental and behavioral disorders: Secondary | ICD-10-CM

## 2023-03-29 DIAGNOSIS — D62 Acute posthemorrhagic anemia: Secondary | ICD-10-CM | POA: Diagnosis not present

## 2023-03-29 DIAGNOSIS — Y92009 Unspecified place in unspecified non-institutional (private) residence as the place of occurrence of the external cause: Secondary | ICD-10-CM | POA: Diagnosis not present

## 2023-03-29 DIAGNOSIS — O34211 Maternal care for low transverse scar from previous cesarean delivery: Secondary | ICD-10-CM | POA: Diagnosis present

## 2023-03-29 DIAGNOSIS — O99214 Obesity complicating childbirth: Secondary | ICD-10-CM | POA: Diagnosis present

## 2023-03-29 DIAGNOSIS — W010XXA Fall on same level from slipping, tripping and stumbling without subsequent striking against object, initial encounter: Secondary | ICD-10-CM | POA: Diagnosis present

## 2023-03-29 DIAGNOSIS — Z833 Family history of diabetes mellitus: Secondary | ICD-10-CM

## 2023-03-29 DIAGNOSIS — Y9301 Activity, walking, marching and hiking: Secondary | ICD-10-CM | POA: Diagnosis present

## 2023-03-29 DIAGNOSIS — O4103X0 Oligohydramnios, third trimester, not applicable or unspecified: Secondary | ICD-10-CM | POA: Diagnosis present

## 2023-03-29 DIAGNOSIS — O99210 Obesity complicating pregnancy, unspecified trimester: Secondary | ICD-10-CM | POA: Diagnosis present

## 2023-03-29 DIAGNOSIS — Z3A39 39 weeks gestation of pregnancy: Secondary | ICD-10-CM

## 2023-03-29 LAB — CBC
HCT: 36.3 % (ref 36.0–46.0)
Hemoglobin: 11.4 g/dL — ABNORMAL LOW (ref 12.0–15.0)
MCH: 22.6 pg — ABNORMAL LOW (ref 26.0–34.0)
MCHC: 31.4 g/dL (ref 30.0–36.0)
MCV: 71.9 fL — ABNORMAL LOW (ref 80.0–100.0)
Platelets: 190 10*3/uL (ref 150–400)
RBC: 5.05 MIL/uL (ref 3.87–5.11)
RDW: 15.9 % — ABNORMAL HIGH (ref 11.5–15.5)
WBC: 6.5 10*3/uL (ref 4.0–10.5)
nRBC: 0 % (ref 0.0–0.2)

## 2023-03-29 LAB — CREATININE, SERUM
Creatinine, Ser: 0.58 mg/dL (ref 0.44–1.00)
GFR, Estimated: >60 mL/min (ref >60)

## 2023-03-29 LAB — RPR: RPR Ser Ql: NONREACTIVE

## 2023-03-29 SURGERY — Surgical Case
Anesthesia: Spinal | Laterality: Bilateral

## 2023-03-29 MED ORDER — DEXAMETHASONE SODIUM PHOSPHATE 10 MG/ML IJ SOLN
INTRAMUSCULAR | Status: DC | PRN
Start: 2023-03-29 — End: 2023-03-29
  Administered 2023-03-29: 10 mg via INTRAVENOUS

## 2023-03-29 MED ORDER — OXYTOCIN-SODIUM CHLORIDE 30-0.9 UT/500ML-% IV SOLN: 2.5000 [IU]/h | INTRAVENOUS | Status: AC

## 2023-03-29 MED ORDER — MORPHINE SULFATE (PF) 0.5 MG/ML IJ SOLN
INTRAMUSCULAR | Status: AC
  Filled 2023-03-29: qty 10

## 2023-03-29 MED ORDER — SODIUM CHLORIDE 0.9% FLUSH
INTRAVENOUS | Status: DC | PRN
  Administered 2023-03-29: 20 mL via INTRAVENOUS

## 2023-03-29 MED ORDER — ACETAMINOPHEN 500 MG PO TABS: 1000.0000 mg | ORAL_TABLET | Freq: Four times a day (QID) | ORAL | Status: DC | PRN

## 2023-03-29 MED ORDER — NALOXONE HCL 0.4 MG/ML IJ SOLN: 0.4000 mg | INTRAMUSCULAR | Status: DC | PRN

## 2023-03-29 MED ORDER — IBUPROFEN 600 MG PO TABS
600.0000 mg | ORAL_TABLET | Freq: Four times a day (QID) | ORAL | Status: DC
  Administered 2023-03-31 (×2): 600 mg via ORAL
  Filled 2023-03-29 (×2): qty 1

## 2023-03-29 MED ORDER — TERBUTALINE SULFATE 1 MG/ML IJ SOLN
0.2500 mg | Freq: Once | INTRAMUSCULAR | Status: AC
  Administered 2023-03-29: 0.25 mg via SUBCUTANEOUS
  Filled 2023-03-29: qty 1

## 2023-03-29 MED ORDER — BUPIVACAINE HCL (PF) 0.25 % IJ SOLN
INTRAMUSCULAR | Status: DC | PRN
  Administered 2023-03-29: 60 mL

## 2023-03-29 MED ORDER — MORPHINE SULFATE (PF) 0.5 MG/ML IJ SOLN
INTRAMUSCULAR | Status: DC | PRN
  Administered 2023-03-29: .1 mg via EPIDURAL

## 2023-03-29 MED ORDER — OXYTOCIN-SODIUM CHLORIDE 30-0.9 UT/500ML-% IV SOLN
INTRAVENOUS | Status: DC | PRN
  Administered 2023-03-29: 1350 mL/h via INTRAVENOUS

## 2023-03-29 MED ORDER — SIMETHICONE 80 MG PO CHEW: 80.0000 mg | CHEWABLE_TABLET | ORAL | Status: DC | PRN

## 2023-03-29 MED ORDER — CEFAZOLIN IN SODIUM CHLORIDE 3-0.9 GM/100ML-% IV SOLN
3.0000 g | INTRAVENOUS | Status: DC
  Filled 2023-03-29: qty 100

## 2023-03-29 MED ORDER — BUPIVACAINE IN DEXTROSE 0.75-8.25 % IT SOLN
INTRATHECAL | Status: DC | PRN
  Administered 2023-03-29: 1.6 mL via INTRATHECAL

## 2023-03-29 MED ORDER — KETOROLAC TROMETHAMINE 30 MG/ML IJ SOLN
INTRAMUSCULAR | Status: DC | PRN
Start: 2023-03-29 — End: 2023-03-29
  Administered 2023-03-29: 30 mg via INTRAVENOUS

## 2023-03-29 MED ORDER — ACETAMINOPHEN 500 MG PO TABS
1000.0000 mg | ORAL_TABLET | Freq: Four times a day (QID) | ORAL | Status: DC
  Administered 2023-03-29 – 2023-03-31 (×7): 1000 mg via ORAL
  Filled 2023-03-29 (×7): qty 2

## 2023-03-29 MED ORDER — DEXTROSE 5 % IV SOLN
INTRAVENOUS | Status: DC | PRN
  Administered 2023-03-29: 3 g via INTRAVENOUS

## 2023-03-29 MED ORDER — ENOXAPARIN SODIUM 40 MG/0.4ML IJ SOSY
40.0000 mg | PREFILLED_SYRINGE | INTRAMUSCULAR | Status: DC
  Administered 2023-03-30: 40 mg via SUBCUTANEOUS
  Filled 2023-03-29: qty 0.4

## 2023-03-29 MED ORDER — ACETAMINOPHEN 500 MG PO TABS: 1000.0000 mg | ORAL_TABLET | Freq: Once | ORAL | Status: AC

## 2023-03-29 MED ORDER — SOD CITRATE-CITRIC ACID 500-334 MG/5ML PO SOLN
ORAL | Status: AC
  Administered 2023-03-29: 30 mL via ORAL
  Filled 2023-03-29: qty 15

## 2023-03-29 MED ORDER — ONDANSETRON HCL 4 MG/2ML IJ SOLN: 4.0000 mg | Freq: Three times a day (TID) | INTRAMUSCULAR | Status: DC | PRN

## 2023-03-29 MED ORDER — SENNOSIDES-DOCUSATE SODIUM 8.6-50 MG PO TABS
2.0000 | ORAL_TABLET | Freq: Every day | ORAL | Status: DC
  Administered 2023-03-30 – 2023-03-31 (×2): 2 via ORAL
  Filled 2023-03-29 (×2): qty 2

## 2023-03-29 MED ORDER — ACETAMINOPHEN 500 MG PO TABS
1000.0000 mg | ORAL_TABLET | Freq: Once | ORAL | Status: AC
  Administered 2023-03-29: 1000 mg via ORAL
  Filled 2023-03-29: qty 2

## 2023-03-29 MED ORDER — LACTATED RINGERS IV SOLN: INTRAVENOUS | Status: DC

## 2023-03-29 MED ORDER — DIPHENHYDRAMINE HCL 25 MG PO CAPS: 25.0000 mg | ORAL_CAPSULE | Freq: Four times a day (QID) | ORAL | Status: DC | PRN

## 2023-03-29 MED ORDER — LACTATED RINGERS IV SOLN
INTRAVENOUS | Status: DC | PRN
Start: 2023-03-29 — End: 2023-03-29

## 2023-03-29 MED ORDER — FENTANYL CITRATE (PF) 100 MCG/2ML IJ SOLN
INTRAMUSCULAR | Status: AC
  Filled 2023-03-29: qty 2

## 2023-03-29 MED ORDER — GABAPENTIN 100 MG PO CAPS
100.0000 mg | ORAL_CAPSULE | Freq: Every day | ORAL | Status: DC
  Administered 2023-03-29 – 2023-03-30 (×2): 100 mg via ORAL
  Filled 2023-03-29 (×2): qty 1

## 2023-03-29 MED ORDER — GABAPENTIN 300 MG PO CAPS
ORAL_CAPSULE | ORAL | Status: AC
  Administered 2023-03-29: 300 mg via ORAL
  Filled 2023-03-29: qty 1

## 2023-03-29 MED ORDER — ACETAMINOPHEN 500 MG PO TABS: 1000.0000 mg | ORAL_TABLET | Freq: Four times a day (QID) | ORAL | Status: DC

## 2023-03-29 MED ORDER — MENTHOL 3 MG MT LOZG: 1.0000 | LOZENGE | OROMUCOSAL | Status: DC | PRN

## 2023-03-29 MED ORDER — SODIUM CHLORIDE 0.9% FLUSH: 3.0000 mL | INTRAVENOUS | Status: DC | PRN

## 2023-03-29 MED ORDER — PHENYLEPHRINE HCL-NACL 20-0.9 MG/250ML-% IV SOLN
INTRAVENOUS | Status: AC
  Filled 2023-03-29: qty 250

## 2023-03-29 MED ORDER — OXYTOCIN-SODIUM CHLORIDE 30-0.9 UT/500ML-% IV SOLN
INTRAVENOUS | Status: AC
  Administered 2023-03-29: 2.5 [IU]/h via INTRAVENOUS
  Filled 2023-03-29: qty 1000

## 2023-03-29 MED ORDER — SIMETHICONE 80 MG PO CHEW
80.0000 mg | CHEWABLE_TABLET | Freq: Three times a day (TID) | ORAL | Status: DC
  Administered 2023-03-30 – 2023-03-31 (×5): 80 mg via ORAL
  Filled 2023-03-29 (×5): qty 1

## 2023-03-29 MED ORDER — SOD CITRATE-CITRIC ACID 500-334 MG/5ML PO SOLN: 30.0000 mL | ORAL | Status: AC

## 2023-03-29 MED ORDER — OXYCODONE HCL 5 MG PO TABS: 5.0000 mg | ORAL_TABLET | ORAL | Status: DC | PRN

## 2023-03-29 MED ORDER — SODIUM CHLORIDE 0.9 % IV SOLN
500.0000 mg | INTRAVENOUS | Status: AC
  Administered 2023-03-29: 500 mg via INTRAVENOUS
  Filled 2023-03-29: qty 5

## 2023-03-29 MED ORDER — NALOXONE HCL 4 MG/10ML IJ SOLN: 1.0000 ug/kg/h | INTRAVENOUS | Status: DC | PRN

## 2023-03-29 MED ORDER — GABAPENTIN 300 MG PO CAPS: 300.0000 mg | ORAL_CAPSULE | Freq: Once | ORAL | Status: AC

## 2023-03-29 MED ORDER — FENTANYL CITRATE (PF) 100 MCG/2ML IJ SOLN
INTRAMUSCULAR | Status: DC | PRN
  Administered 2023-03-29: 15 ug via INTRAVENOUS

## 2023-03-29 MED ORDER — KETOROLAC TROMETHAMINE 30 MG/ML IJ SOLN: 30.0000 mg | Freq: Four times a day (QID) | INTRAMUSCULAR | Status: DC

## 2023-03-29 MED ORDER — ZOLPIDEM TARTRATE 5 MG PO TABS: 5.0000 mg | ORAL_TABLET | Freq: Every evening | ORAL | Status: DC | PRN

## 2023-03-29 MED ORDER — DIPHENHYDRAMINE HCL 25 MG PO CAPS: 25.0000 mg | ORAL_CAPSULE | ORAL | Status: DC | PRN

## 2023-03-29 MED ORDER — WITCH HAZEL-GLYCERIN EX PADS: 1.0000 | MEDICATED_PAD | CUTANEOUS | Status: DC | PRN

## 2023-03-29 MED ORDER — COCONUT OIL OIL: 1.0000 | TOPICAL_OIL | Status: DC | PRN

## 2023-03-29 MED ORDER — KETOROLAC TROMETHAMINE 30 MG/ML IJ SOLN
30.0000 mg | Freq: Four times a day (QID) | INTRAMUSCULAR | Status: AC
  Administered 2023-03-30 (×4): 30 mg via INTRAVENOUS
  Filled 2023-03-29 (×4): qty 1

## 2023-03-29 MED ORDER — DIBUCAINE (PERIANAL) 1 % EX OINT: 1.0000 | TOPICAL_OINTMENT | CUTANEOUS | Status: DC | PRN

## 2023-03-29 MED ORDER — 0.9 % SODIUM CHLORIDE (POUR BTL) OPTIME
TOPICAL | Status: DC | PRN
  Administered 2023-03-29: 1000 mL

## 2023-03-29 MED ORDER — BUPIVACAINE HCL (PF) 0.25 % IJ SOLN
INTRAMUSCULAR | Status: AC
  Filled 2023-03-29: qty 60

## 2023-03-29 MED ORDER — ONDANSETRON HCL 4 MG/2ML IJ SOLN
INTRAMUSCULAR | Status: DC | PRN
  Administered 2023-03-29: 4 mg via INTRAVENOUS

## 2023-03-29 MED ORDER — DIPHENHYDRAMINE HCL 50 MG/ML IJ SOLN: 12.5000 mg | INTRAMUSCULAR | Status: DC | PRN

## 2023-03-29 MED ORDER — PHENYLEPHRINE HCL-NACL 20-0.9 MG/250ML-% IV SOLN
INTRAVENOUS | Status: DC | PRN
Start: 2023-03-29 — End: 2023-03-29
  Administered 2023-03-29: 50 ug/min via INTRAVENOUS

## 2023-03-29 MED ORDER — LACTATED RINGERS IV SOLN: Freq: Once | INTRAVENOUS | Status: DC

## 2023-03-29 MED ORDER — ACETAMINOPHEN 500 MG PO TABS
ORAL_TABLET | ORAL | Status: AC
  Administered 2023-03-29: 1000 mg via ORAL
  Filled 2023-03-29: qty 2

## 2023-03-29 MED ORDER — PRENATAL MULTIVITAMIN CH
1.0000 | ORAL_TABLET | Freq: Every day | ORAL | Status: DC
  Administered 2023-03-30 – 2023-03-31 (×2): 1 via ORAL
  Filled 2023-03-29 (×2): qty 1

## 2023-03-29 MED ORDER — SCOPOLAMINE 1 MG/3DAYS TD PT72: 1.0000 | MEDICATED_PATCH | Freq: Once | TRANSDERMAL | Status: DC

## 2023-03-29 SURGICAL SUPPLY — 34 items
APL PRP STRL LF DISP 70% ISPRP (MISCELLANEOUS) ×1
BARRIER ADHS 3X4 INTERCEED (GAUZE/BANDAGES/DRESSINGS) ×1 IMPLANT
BNDG TENSOPLAST 6X5 (GAUZE/BANDAGES/DRESSINGS) IMPLANT
BRR ADH 4X3 ABS CNTRL BYND (GAUZE/BANDAGES/DRESSINGS) ×1
CHLORAPREP W/TINT 26 (MISCELLANEOUS) ×1 IMPLANT
DRSG CURAFIL 4X4 STRL (GAUZE/BANDAGES/DRESSINGS) IMPLANT
DRSG TELFA 3X8 NADH STRL (GAUZE/BANDAGES/DRESSINGS) ×1 IMPLANT
ELECT CAUTERY BLADE 6.4 (BLADE) ×1 IMPLANT
ELECT REM PT RETURN 9FT ADLT (ELECTROSURGICAL) ×1
ELECTRODE REM PT RTRN 9FT ADLT (ELECTROSURGICAL) ×1 IMPLANT
GAUZE CURAFIL 4X4 (GAUZE/BANDAGES/DRESSINGS) IMPLANT
GAUZE SPONGE 4X4 12PLY STRL (GAUZE/BANDAGES/DRESSINGS) ×1 IMPLANT
GLOVE SURG SYN 8.0 (GLOVE) ×1 IMPLANT
GLOVE SURG SYN 8.0 PF PI (GLOVE) ×1 IMPLANT
GOWN STRL REUS W/ TWL LRG LVL3 (GOWN DISPOSABLE) ×2 IMPLANT
GOWN STRL REUS W/ TWL XL LVL3 (GOWN DISPOSABLE) ×1 IMPLANT
GOWN STRL REUS W/TWL LRG LVL3 (GOWN DISPOSABLE) ×2
GOWN STRL REUS W/TWL XL LVL3 (GOWN DISPOSABLE) ×1
MANIFOLD NEPTUNE II (INSTRUMENTS) ×1 IMPLANT
MAT PREVALON FULL STRYKER (MISCELLANEOUS) ×1 IMPLANT
NDL HYPO 22X1.5 SAFETY MO (MISCELLANEOUS) ×1 IMPLANT
NEEDLE HYPO 22X1.5 SAFETY MO (MISCELLANEOUS) ×1 IMPLANT
NS IRRIG 1000ML POUR BTL (IV SOLUTION) ×1 IMPLANT
PACK C SECTION AR (MISCELLANEOUS) ×1 IMPLANT
PAD OB MATERNITY 4.3X12.25 (PERSONAL CARE ITEMS) ×1 IMPLANT
PAD PREP OB/GYN DISP 24X41 (PERSONAL CARE ITEMS) ×1 IMPLANT
SCRUB CHG 4% DYNA-HEX 4OZ (MISCELLANEOUS) ×1 IMPLANT
STRAP SAFETY 5IN WIDE (MISCELLANEOUS) ×1 IMPLANT
SUT CHROMIC 1 CTX 36 (SUTURE) ×3 IMPLANT
SUT PLAIN GUT 0 (SUTURE) ×2 IMPLANT
SUT VIC AB 0 CT1 36 (SUTURE) ×2 IMPLANT
SYR 30ML LL (SYRINGE) ×2 IMPLANT
TRAP FLUID SMOKE EVACUATOR (MISCELLANEOUS) ×1 IMPLANT
WATER STERILE IRR 500ML POUR (IV SOLUTION) ×1 IMPLANT

## 2023-03-29 NOTE — Progress Notes (Addendum)
Patient ID: Denise [name], female   DOB: [birth year], 22 y.o.   MRN: [id number] Update from CNM . Intermittent late decels with ctx .  AFI 5.2  and MVP 3 , .  Recommend CST and if + move to surgery today .

## 2023-03-29 NOTE — Anesthesia Preprocedure Evaluation (Signed)
Anesthesia Evaluation  Patient identified by MRN, date of birth, ID band Patient awake  General Assessment Comment:  Secondary C/S. Prior C/S using epidural, no anesthetic complications noted by patient. NPO appropriate. Patient has a swollen left eyelid. She says she woke up like this, unsure of any triggering factor. She states the periorbital area is tender, but denies any vision changes. Denies ever having this before, or any hx of allergic reactions manifesting as such.  Reviewed: Allergy & Precautions, NPO status , Patient's Chart, lab work & pertinent test results  History of Anesthesia Complications Negative for: history of anesthetic complications  Airway Mallampati: III  TM Distance: >3 FB Neck ROM: Full    Dental no notable dental hx. (+) Teeth Intact   Pulmonary neg pulmonary ROS, neg sleep apnea, neg COPD, Patient abstained from smoking.Not current smoker   Pulmonary exam normal breath sounds clear to auscultation       Cardiovascular Exercise Tolerance: Good METS(-) hypertension(-) CAD and (-) Past MI negative cardio ROS (-) dysrhythmias  Rhythm:Regular Rate:Normal - Systolic murmurs    Neuro/Psych  PSYCHIATRIC DISORDERS Anxiety     negative neurological ROS     GI/Hepatic ,neg GERD  ,,(+)     (-) substance abuse    Endo/Other  neg diabetes  Morbid obesity  Renal/GU negative Renal ROS     Musculoskeletal   Abdominal  (+) + obese  Peds  Hematology  (+) Blood dyscrasia, anemia Denies bleeding disorders or blood thinner use   Anesthesia Other Findings Past Medical History: No date: ADHD No date: Anemia affecting pregnancy in third trimester No date: History of gestational hypertension     Comment:  2023 pregnancy No date: Premature baby     Comment:  a.) patient born at 6 weeks No date: Premature puberty     Comment:  a.) maternal menarche was at 22 years of age No date: PTSD (post-traumatic  stress disorder) No date: Tachycardia  Reproductive/Obstetrics (+) Pregnancy                             Anesthesia Physical Anesthesia Plan  ASA: 3  Anesthesia Plan: Spinal   Post-op Pain Management: Toradol IV (intra-op)*   Induction:   PONV Risk Score and Plan: 3 and Ondansetron and Dexamethasone  Airway Management Planned: Natural Airway  Additional Equipment:   Intra-op Plan:   Post-operative Plan:   Informed Consent: I have reviewed the patients History and Physical, chart, labs and discussed the procedure including the risks, benefits and alternatives for the proposed anesthesia with the patient or authorized representative who has indicated his/her understanding and acceptance.       Plan Discussed with: CRNA and Surgeon  Anesthesia Plan Comments: (Discussed R/B/A of neuraxial anesthesia technique with patient: - rare risks of spinal/epidural hematoma, nerve damage, infection - Risk of PDPH  - Risk of itching - Risk of nausea and vomiting - Risk of conversion to general anesthesia and its associated risks, including sore throat, damage to lips/teeth/oropharynx, and rare risks such as cardiac and respiratory events. - Risk of surgical bleeding requiring blood products - Risk of allergic reactions                    Discussed that in obesity there can be increased difficulty with spinal placement or even failure of successful spinal. Discussed increased risk of conversion to GA, and the increased attendant risks involved with that due to obesity.  Discussed CRNA involvement in care.)       Anesthesia Quick Evaluation

## 2023-03-29 NOTE — Brief Op Note (Signed)
03/29/2023  6:45 PM  PATIENT:  Denise [name]  22 y.o. female  PRE-OPERATIVE DIAGNOSIS:  elective repeat cesarean   POST-OPERATIVE DIAGNOSIS:  elective repeat cesarean  PROCEDURE:  Repeat Low transverse cesarean section   SURGEON:  Surgeons and Role:    * Shauntel Prest, Ihor Austin, MD - Primary  PHYSICIAN ASSISTANT: Herb Grays   ASSISTANTS: none   ANESTHESIA:   spinal  EBL:  500 mL IOF 200 UO 10 cc  BLOOD ADMINISTERED:none  DRAINS: Urinary Catheter (Foley)   LOCAL MEDICATIONS USED:  MARCAINE     SPECIMEN:  No Specimen  DISPOSITION OF SPECIMEN:  N/A  COUNTS:  YES  TOURNIQUET:  * No tourniquets in log *  DICTATION: .Other Dictation: Dictation Number verbal  PLAN OF CARE: Admit to inpatient   PATIENT DISPOSITION:  PACU - hemodynamically stable.   Delay start of Pharmacological VTE agent (>24hrs) due to surgical blood loss or risk of bleeding: not applicable

## 2023-03-29 NOTE — Discharge Summary (Signed)
Obstetrical Discharge Summary  Patient Name: Denise [name] DOB: Mar 08, 2001 MRN: [id number]  Date of Admission: 03/29/2023 Date of Delivery:03/29/23 Delivered by: Beverly Gust MD Date of Discharge: 03/31/2023   [facility] [id number] last menstrual period was 06/27/2022 (exact date). EDC Estimated Date of Delivery: 04/03/23 Gestational Age at Delivery: [redacted]w[redacted]d   Antepartum complications:  Principal Problem:   Fall at home Active Problems:   Previous cesarean delivery affecting pregnancy   Post-operative state   Cesarean delivery delivered   Obesity affecting pregnancy   Admitting Diagnosis:  Secondary Diagnosis: Patient Active Problem List   Diagnosis Date Noted   Cesarean delivery delivered 03/31/2023   Obesity affecting pregnancy 03/31/2023   Fall at home 03/29/2023   Previous cesarean delivery affecting pregnancy 03/29/2023   Post-operative state 03/29/2023   Supervision of high risk pregnancy in third trimester 11/17/2022   Morbid obesity (HCC) 230 lbs 03/03/2021   Post traumatic stress disorder (PTSD) 02/20/2014   Conduct disorder, 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   ADHD (attention deficit hyperactivity disorder), 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    Augmentation: N/A Complications: None Intrapartum complications/course:  Date of Delivery:  Delivered By: Beverly Gust MD Delivery Type: repeat cesarean section, low transverse incision Anesthesia: spinal Placenta: manual  Laceration:  Episiotomy: none Newborn Data: Live born female  Birth Weight: 7 lb 0.9 oz (3200 g) APGAR: 8, 9  Newborn Delivery   Birth date/time: 03/29/2023 18:12:00 Delivery type: C-Section, Low Transverse Trial of labor: No C-section categorization: Repeat    Postpartum Procedures: None   Edinburgh:     03/30/2023    8:30 AM 12/25/2021    1:30 PM  Edinburgh Postnatal Depression Scale Screening Tool  I have been able to laugh and see the funny side of  things. 0 0  I have looked forward with enjoyment to things. 0 1  I have blamed myself unnecessarily when things went wrong. 0 0  I have been anxious or worried for no good reason. 2 0  I have felt scared or panicky for no good reason. 0 0  Things have been getting on top of me. 1 1  I have been so unhappy that I have had difficulty sleeping. 0 0  I have felt sad or miserable. 0 0  I have been so unhappy that I have been crying. 0 0  The thought of harming myself has occurred to me. 0 0  Edinburgh Postnatal Depression Scale Total 3 2    Post partum course:  Patient had an uncomplicated postpartum course.  By time of discharge on POD#2, her pain was controlled on oral pain medications; she had appropriate lochia and was ambulating, voiding without difficulty, tolerating regular diet and passing flatus.   She was deemed stable for discharge to home.    Discharge Physical Exam:  BP 123/73 (BP Location: Left Arm)   Pulse 87   Temp 98.4 F (36.9 C) (Oral)   Resp 18   Ht 5\' 1"  (1.549 m)   Wt 120.2 kg   LMP 06/27/2022 (Exact Date)   SpO2 100%   Breastfeeding Unknown   BMI 50.07 kg/m   General: NAD CV: RRR Pulm: CTABL, nl effort ABD: s/nd/nt, fundus firm and below the umbilicus Lochia: moderate Incision: c/d/i DVT Evaluation: LE non-ttp, no evidence of DVT on exam.  Hemoglobin  Date Value Ref Range Status  03/30/2023 9.9 (L) 12.0 - 15.0 g/dL Final   HGB  Date Value Ref Range Status  02/18/2014 12.4 12.0 -  16.0 g/dL Final   HCT  Date Value Ref Range Status  03/30/2023 30.9 (L) 36.0 - 46.0 % Final  02/18/2014 38.5 35.0 - 47.0 % Final     Disposition: stable, discharge to home. Baby Feeding: formula Baby Disposition: home with mom  Rh Immune globulin given: N/A Rubella vaccine given: Immune  Tdap vaccine given in AP or PP setting: Given 01/17/2023 Flu vaccine given in AP or PP setting: declined   Risk assessment for postpartum VTE and prophylactic treatment: Very  high risk factors: None High risk factors: If > 1 risk factor OR 1 risk factor + 1 moderate risk factor: 3-6 weeks of LMWH and BMI 40-50 kg/m2 Moderate risk factors: Cesarean delivery   Postpartum VTE prophylaxis with LMWH ordered  Contraception: Liletta IUD   Prenatal Labs:  ABO, Rh:  A+ Antibody:  neg  Rubella:  Imm , vz  Imm RPR:   NR HBsAg:   neg HIV:   neg GBS:   neg    Plan:  Julliette Frentz Cloward was discharged to home in good condition. Follow-up appointment with delivering provider in 2 weeks.  Discharge Medications: Allergies as of 03/31/2023       Reactions   Peanut-containing Drug Products Swelling   Documented in [facility] ED record        Medication List     STOP taking these medications    acetaminophen 500 MG tablet Commonly known as: TYLENOL   aspirin 81 MG chewable tablet       TAKE these medications    enoxaparin 40 MG/0.4ML injection Commonly known as: LOVENOX Inject 0.4 mLs (40 mg total) into the skin daily for 21 days.   ferrous sulfate 325 (65 FE) MG tablet Take 325 mg by mouth daily with breakfast. What changed: Another medication with the same name was removed. Continue taking this medication, and follow the directions you see here.   ibuprofen 600 MG tablet Commonly known as: ADVIL Take 1 tablet (600 mg total) by mouth every 6 (six) hours as needed.   oxyCODONE 5 MG immediate release tablet Commonly known as: Oxy IR/ROXICODONE Take 1 tablet (5 mg total) by mouth every 4 (four) hours as needed for up to 5 days for moderate pain.   Prenatal 19 tablet Take 1 tablet by mouth daily.         Follow-up Information     Egidio Lofgren, Ihor Austin, MD. Schedule an appointment as soon as possible for a visit in 2 week(s).   Specialty: Obstetrics and Gynecology Why: post-op incision check Contact information: [address] [place] [address] [phone]          Julita Ozbun, Ihor Austin, MD. Schedule an appointment as soon as possible for a visit in 6 week(s).   Specialty: Obstetrics and Gynecology Why: postpartum visit, iud inseriton Contact information: [address] [place] [address] [phone]                 Signed:  Margaretmary Eddy, CNM Certified Nurse Midwife [facility]

## 2023-03-29 NOTE — Transfer of Care (Signed)
Immediate Anesthesia Transfer of Care Note  Patient: Denise [name]  Procedure(s) Performed: REPEAT CESAREAN SECTION WITH BILATERAL TUBAL LIGATION (Bilateral)  Patient Location: PACU  Anesthesia Type:General  Level of Consciousness: awake, alert , and oriented  Airway & Oxygen Therapy: Patient Spontanous Breathing  Post-op Assessment: Report given to RN and Post -op Vital signs reviewed and stable  Post vital signs: stable  Last Vitals:  Vitals Value Taken Time  BP    Temp    Pulse    Resp    SpO2      Last Pain:  Vitals:   03/29/23 1150  TempSrc: Oral  PainSc:          Complications: No notable events documented.

## 2023-03-29 NOTE — OB Triage Note (Signed)
Denise [name] 22 y.o. a G2P1001 at 39wk2d presents to Labor & Delivery triage reporting contractions after a fall. Patient states she fell around 0000 today on her left side during cleaning.  Patient reports feeling ctx every 6 minutes at home rating them 7/10. She denies vaginal bleeding or discharge. Patient states that she has a scheduled cesarean section tomorrow (10/9). External FM and TOCO applied to non-tender abdomen. Initial FHR 140. Vital signs obtained. Rubye Oaks, CNM notified of patient's arrival.

## 2023-03-29 NOTE — Progress Notes (Signed)
Denise [name] is a 22 y.o. female. She is at [redacted]w[redacted]d gestation. Patient's last menstrual period was 06/27/2022 (exact date). Estimated Date of Delivery: 04/03/23  Prenatal care site: [facility]  Chief complaint: contractions and pain on left side after a fall  HPI: Denise [name] presents to L&D with complaints of Uterine contractions: Date/time of onset: 03/29/23, Frequency: Every 5 minutes,and Intensity: strong. With a pain rating of 7/10  Factors complicating pregnancy: Obesity ADHD/PTSD Anemia Previous c-section Elevated 1 hr, normal 3 hour  S: Resting comfortably.no VB.no LOF,  Active fetal movement.   Maternal Medical History:  Past Medical Hx:  has a past medical history of ADHD, Anemia affecting pregnancy in third trimester, History of gestational hypertension, Premature baby, Premature puberty, PTSD (post-traumatic stress disorder), and Tachycardia.    Past Surgical Hx:  has a past surgical history that includes Wisdom tooth extraction (Bilateral) and Cesarean section (12/25/2021).   Allergies  Allergen Reactions   Peanut-Containing Drug Products Swelling    Documented in [facility] ED record     Prior to Admission medications   Medication Sig Start Date End Date Taking? Authorizing Provider  Prenatal Vit-DSS-Fe Fum-FA (PRENATAL 19) tablet Take 1 tablet by mouth daily. 04/28/21  Yes Georga Hacking, MD  acetaminophen (TYLENOL) 500 MG tablet Take 2 tablets (1,000 mg total) by mouth every 6 (six) hours. Patient not taking: Reported on 02/11/2022 12/26/21   Janyce Llanos, CNM  aspirin 81 MG chewable tablet Chew 81 mg by mouth daily. Patient not taking: Reported on 03/24/2023    [provider]  ferrous sulfate 325 (65 FE) MG tablet Take 325 mg by mouth daily with breakfast. Patient not taking: Reported on 12/24/2021    [provider]  ferrous sulfate 325 (65 FE) MG tablet Take 325 mg by mouth daily with  breakfast. Patient not taking: Reported on 03/09/2023    [provider]    Social History: She  reports that she has never smoked. She has been exposed to tobacco smoke. She has never used smokeless tobacco. She reports that she does not currently use alcohol after a past usage of about 2.0 standard drinks of alcohol per week. She reports that she does not use drugs.  Family History: family history includes Bipolar disorder in her mother; Cerebral palsy in her half-brother; Diabetes in her maternal grandmother and mother; Enuresis in her half-sister; Hypertension in her maternal grandmother. ,no history of gyn cancers  Review of Systems: A full review of systems was performed and negative except as noted in the HPI.    O:  BP 137/62 (BP Location: Right Arm)   Pulse 78   Temp 99 F (37.2 C) (Oral)   Ht 5\' 1"  (1.549 m)   Wt 120.2 kg   LMP 06/27/2022 (Exact Date)   BMI 50.07 kg/m  Results for orders placed or performed during the hospital encounter of 03/28/23 (from the past 48 hour(s))  CBC   Collection Time: 03/28/23 10:28 AM  Result Value Ref Range   WBC 5.0 4.0 - 10.5 K/uL   RBC 5.15 (H) 3.87 - 5.11 MIL/uL   Hemoglobin 11.6 (L) 12.0 - 15.0 g/dL   HCT 16.1 09.6 - 04.5 %   MCV 71.8 (L) 80.0 - 100.0 fL   MCH 22.5 (L) 26.0 - 34.0 pg   MCHC 31.4 30.0 - 36.0 g/dL   RDW 40.9 (H) 81.1 - 91.4 %   Platelets 181 150 - 400 K/uL   nRBC  0.0 0.0 - 0.2 %  Basic metabolic panel   Collection Time: 03/28/23 10:28 AM  Result Value Ref Range   Sodium 136 135 - 145 mmol/L   Potassium 3.4 (L) 3.5 - 5.1 mmol/L   Chloride 107 98 - 111 mmol/L   CO2 21 (L) 22 - 32 mmol/L   Glucose, Bld 137 (H) 70 - 99 mg/dL   BUN 11 6 - 20 mg/dL   Creatinine, Ser 2.95 0.44 - 1.00 mg/dL   Calcium 8.8 (L) 8.9 - 10.3 mg/dL   GFR, Estimated >62 >13 mL/min   Anion gap 8 5 - 15  Type and screen [facility]   Collection Time: 03/28/23 10:28 AM  Result Value Ref Range   ABO/RH(D) A POS     Antibody Screen NEG    Sample Expiration 03/31/2023,2359    Extend sample reason      PREGNANT WITHIN 3 MONTHS, UNABLE TO EXTEND Performed at [facility], [address] [place], [place] [address]      Constitutional: NAD, AAOx3  HE/ENT: extraocular movements grossly intact, moist mucous membranes CV: RRR PULM: nl respiratory effort, CTABL Abd: gravid, non-tender, non-distended, soft  Ext: Non-tender, Nonedmeatous Psych: mood appropriate, speech normal Pelvic : deferred SVE: Dilation: Closed Effacement (%): Thick Station: Ballotable Exam by:: Robyne Askew RN   NST: Baseline FHR: 145 beats/min Variability: moderate Accelerations: present Decelerations: present, intermittent variables Tocometry: occasional Time: at least 20 minutes   Interpretation: Category II INDICATIONS: rule out uterine contractions and s/p fall RESULTS:  A NST procedure was performed with FHR monitoring and a normal baseline established, appropriate time of 20-40 minutes of evaluation, and accels >2 seen w 15x15 characteristics.  Results show a REACTIVE NST.    Assessment: 22 y.o. [redacted]w[redacted]d here for antenatal surveillance during pregnancy. Patient given terbutaline for contractions and tylenol upon arrival to labor and delivery triage. She reports falling over her child's toys and hitting her side. She denies hitting her belly, VB, LOF, but reports having painful contractions sine the fall that were 5 mins apart. She is scheduled for a repeat c-section tomorrow, will continue to monitor, due to category 2 tracing.   Dr Jean Rosenthal notified  Principle diagnosis: fall   Plan: Labor: not present.  Fetal Wellbeing: Reassuring Cat 2 tracing. Prolonged fetal monitoring AFI ordered   ----- Chari Manning, CNM Certified Nurse Midwife [facility]

## 2023-03-29 NOTE — Progress Notes (Signed)
Triage Progress Note  SURA CANUL is a 22 y.o. G2P1001 at [redacted]w[redacted]d by LMP in triage for a fall with contractions.  Subjective: She is laying in bed with her eyes shut, opens her eyes when spoken to.  Objective: BP 136/70 (BP Location: Right Arm)   Pulse 79   Temp 98.3 F (36.8 C) (Oral)   Resp 18   Ht 5\' 1"  (1.549 m)   Wt 120.2 kg   LMP 06/27/2022 (Exact Date)   BMI 50.07 kg/m  Notable VS details: reviewed  Fetal Assessment: FHT:  FHR: 135 bpm, variability: moderate,  accelerations:  Present,  decelerations:  Present recurrent late decelerations Category/reactivity:  Category II UC:   regular, every 10-15 minutes SVE: closed/thick/high by RN exam Membrane status:intact  Labs: Lab Results  Component Value Date   WBC 5.0 03/28/2023   HGB 11.6 (L) 03/28/2023   HCT 37.0 03/28/2023   MCV 71.8 (L) 03/28/2023   PLT 181 03/28/2023   OB US:  FINDINGS: Number of Fetuses: 1   Heart Rate:  130 bpm   Movement: Yes   Presentation: Cephalic   Placental Location: Anterior   Previa: No   Amniotic Fluid (Subjective):  Subjectively decreased   AFI: 5.2 cm   BPD: 9.3 cm 38 w  0 d   MATERNAL FINDINGS:   Cervix:  Not evaluated (>34 wks)   IMPRESSION: Single living intrauterine fetus in cephalic presentation.   No evidence of placental abruption or previa.   Decreased amniotic fluid volume, with AFI of 5.2 cm.   This exam is performed on an emergent basis and does not comprehensively evaluate fetal size, dating, or anatomy; follow-up complete OB US should be considered if further fetal assessment is warranted.  Assessment / Plan: 22 year old G2P1001 at [redacted]w[redacted]d here with uterine contractions after a fall - Previous cesarean section, repeat cesarean section scheduled 03/30/23  Called Dr. Feliberto Gottron and discussed plan of care, [place] report, and fetal tracing with him. Reviewed [place] images and the MVP on the AFI is technically about 3cm, but there is likely umbilical  cord seen in the pocket of fluid. To help determine whether to proceed with c/s today or wait until the scheduled time tomorrow, Dr. Feliberto Gottron requests CST to be performed. Discussed with patient and she is agreeable with POC. Educated about nipple stimulation on/17min off for up to 1hr to cause 3+ contractions in a segment for . If unable to cause 3+ contractions in a segment for with nipple stimulation, will do low-dose pitocin. Patient NPO at this time.   Janyce Llanos, CNM 03/29/2023, 12:07 PM

## 2023-03-29 NOTE — Progress Notes (Signed)
We have decided since she has relative oligohydramnios and she has had intermittent late decels and she is 39+[redacted] weeks EGA we will proceed on for elective repeat LTCS . She now declines BTL . New consent signed .  She has been NPO

## 2023-03-29 NOTE — Anesthesia Procedure Notes (Signed)
Spinal  Patient location during procedure: OR Start time: 03/29/2023 5:54 PM End time: 03/29/2023 5:54 PM Reason for block: surgical anesthesia Staffing Anesthesiologist: Corinda Gubler, MD Resident/CRNA: Maryla Morrow., CRNA Performed by: Maryla Morrow., CRNA Authorized by: Corinda Gubler, MD   Preanesthetic Checklist Completed: patient identified, IV checked, site marked, risks and benefits discussed, surgical consent, monitors and equipment checked, pre-op evaluation and timeout performed Spinal Block Patient position: sitting Prep: Betadine Patient monitoring: heart rate, continuous pulse ox, blood pressure and cardiac monitor Approach: midline Location: L4-5 Injection technique: single-shot Needle Needle type: Whitacre and Introducer  Needle gauge: 24 G Needle length: 9 cm Assessment Sensory level: T4 Events: CSF return Additional Notes Negative paresthesia. Negative blood return. Positive free-flowing CSF. Expiration date of kit checked and confirmed. Patient tolerated procedure well, without complications.

## 2023-03-30 ENCOUNTER — Encounter: Payer: Self-pay | Admitting: Obstetrics and Gynecology

## 2023-03-30 LAB — CBC
HCT: 30.9 % — ABNORMAL LOW (ref 36.0–46.0)
Hemoglobin: 9.9 g/dL — ABNORMAL LOW (ref 12.0–15.0)
MCH: 22.7 pg — ABNORMAL LOW (ref 26.0–34.0)
MCHC: 32 g/dL (ref 30.0–36.0)
MCV: 70.7 fL — ABNORMAL LOW (ref 80.0–100.0)
Platelets: 179 10*3/uL (ref 150–400)
RBC: 4.37 MIL/uL (ref 3.87–5.11)
RDW: 15.6 % — ABNORMAL HIGH (ref 11.5–15.5)
WBC: 8.9 10*3/uL (ref 4.0–10.5)
nRBC: 0 % (ref 0.0–0.2)

## 2023-03-30 MED ORDER — FERROUS SULFATE 325 (65 FE) MG PO TABS
325.0000 mg | ORAL_TABLET | Freq: Two times a day (BID) | ORAL | Status: DC
  Administered 2023-03-30 – 2023-03-31 (×3): 325 mg via ORAL
  Filled 2023-03-30 (×3): qty 1

## 2023-03-30 MED FILL — Morphine Sulfate Inj PF 0.5 MG/ML: INTRAMUSCULAR | Qty: 10 | Status: AC

## 2023-03-30 NOTE — Progress Notes (Signed)
Post Partum Day 1  Subjective: Doing well, no concerns. Ambulating without difficulty, pain managed with PO meds, tolerating regular diet, and voiding without difficulty.   No fever/chills, chest pain, shortness of breath, nausea/vomiting, or leg pain. No nipple or breast pain. No headache, visual changes, or RUQ/epigastric pain.  Objective: BP 128/74 (BP Location: Left Arm)   Pulse 84   Temp 98.9 F (37.2 C) (Oral)   Resp 16   Ht 5\' 1"  (1.549 m)   Wt 120.2 kg   LMP 06/27/2022 (Exact Date)   SpO2 95%   Breastfeeding Unknown   BMI 50.07 kg/m    Physical Exam:  General: alert and cooperative Breasts: soft/nontender CV: RRR Pulm: nl effort Abdomen: soft, non-tender Uterine Fundus: firm Incision: healing well, no significant drainage Perineum: minimal edema Lochia: appropriate DVT Evaluation: No evidence of DVT seen on physical exam. [place]:     12/25/2021    1:30 PM  Edinburgh Postnatal Depression Scale Screening Tool  I have been able to laugh and see the funny side of things. 0  I have looked forward with enjoyment to things. 1  I have blamed myself unnecessarily when things went wrong. 0  I have been anxious or worried for no good reason. 0  I have felt scared or panicky for no good reason. 0  Things have been getting on top of me. 1  I have been so unhappy that I have had difficulty sleeping. 0  I have felt sad or miserable. 0  I have been so unhappy that I have been crying. 0  The thought of harming myself has occurred to me. 0  Edinburgh Postnatal Depression Scale Total 2     Recent Labs    03/29/23 2121 03/30/23 0542  HGB 11.4* 9.9*  HCT 36.3 30.9*  WBC 6.5 8.9  PLT 190 179    Assessment/Plan: 22 y.o. G2P2002 postpartum day # 1  1. Continue routine postpartum care  2. Infant feeding status: formula feeding  3. Contraception plan: bilateral tubal ligation - completed with c/s  4. Acute blood loss anemia - clinically significant.   -Hemodynamically stable and asymptomatic -Intervention: continue on oral supplementation with ferrous sulfate 325  5. Immunization status:   all immunizations up to date    Disposition: Continue inpatient postpartum care    LOS: 1 day   Deija Buhrman, CNM 03/30/2023, 11:17 AM

## 2023-03-30 NOTE — Anesthesia Post-op Follow-up Note (Signed)
  Anesthesia Pain Follow-up Note  Patient: ALYRA PATTY  Day #: 1  Date of Follow-up: 03/30/2023 Time: 7:32 AM  Last Vitals:  Vitals:   03/30/23 0400 03/30/23 0500  BP:    Pulse: 70 64  Resp:    Temp:    SpO2: 97% 96%    Level of Consciousness: alert  Pain: none   Side Effects:None  Catheter Site Exam:clean, dry, no drainage  Anti-Coag Meds (From admission, onward)    Start     Dose/Rate Route Frequency Ordered Stop   03/30/23 1900  enoxaparin (LOVENOX) injection 40 mg        40 mg Subcutaneous Every 24 hours 03/29/23 2053          Plan: D/C from anesthesia care at surgeon's request  Rosanne Gutting

## 2023-03-30 NOTE — Anesthesia Postprocedure Evaluation (Signed)
Anesthesia Post Note  Patient: Denise [name]  Procedure(s) Performed: REPEAT CESAREAN SECTION WITH BILATERAL TUBAL LIGATION (Bilateral)  Patient location during evaluation: Mother Baby Anesthesia Type: Spinal Level of consciousness: oriented and awake and alert Pain management: pain level controlled Vital Signs Assessment: post-procedure vital signs reviewed and stable Respiratory status: spontaneous breathing and respiratory function stable Cardiovascular status: blood pressure returned to baseline and stable Postop Assessment: no headache, no backache, no apparent nausea or vomiting and able to ambulate Anesthetic complications: no   There were no known notable events for this encounter.   Last Vitals:  Vitals:   03/30/23 0400 03/30/23 0500  BP:    Pulse: 70 64  Resp:    Temp:    SpO2: 97% 96%    Last Pain:  Vitals:   03/30/23 0120  TempSrc:   PainSc: 0-No pain                 Malachi Pro C

## 2023-03-31 DIAGNOSIS — O99210 Obesity complicating pregnancy, unspecified trimester: Secondary | ICD-10-CM | POA: Diagnosis present

## 2023-03-31 MED ORDER — IBUPROFEN 600 MG PO TABS: 600.0000 mg | ORAL_TABLET | Freq: Four times a day (QID) | ORAL | 1 refills | Status: AC | PRN

## 2023-03-31 MED ORDER — ENOXAPARIN SODIUM 40 MG/0.4ML IJ SOSY: 40.0000 mg | PREFILLED_SYRINGE | INTRAMUSCULAR | 0 refills | Status: AC

## 2023-03-31 MED ORDER — OXYCODONE HCL 5 MG PO TABS: 5.0000 mg | ORAL_TABLET | ORAL | 0 refills | Status: AC | PRN

## 2023-03-31 NOTE — Progress Notes (Signed)
Postop Day  2  Subjective: 22 y.o. [id number] postpartum day #2 status post repeat cesarean section. She is ambulating, is tolerating po, is voiding spontaneously.  Her pain is well controlled on PO pain medications. Her lochia is less than menses.  Objective: BP 123/73 (BP Location: Left Arm)   Pulse 87   Temp 98.4 F (36.9 C) (Oral)   Resp 18   Ht 5\' 1"  (1.549 m)   Wt 120.2 kg   LMP 06/27/2022 (Exact Date)   SpO2 100%   Breastfeeding Unknown   BMI 50.07 kg/m    Physical Exam:  General: alert, cooperative, and no distress Breasts: soft/nontender Pulm: nl effort Abdomen: soft, non-tender, active bowel sounds Uterine Fundus: firm Incision: covered with pressure dressing, no drainage  Perineum: minimal edema, intact Lochia: appropriate DVT Evaluation: No evidence of DVT seen on physical exam.  Recent Labs    03/29/23 2121 03/30/23 0542  HGB 11.4* 9.9*  HCT 36.3 30.9*  WBC 6.5 8.9  PLT 190 179    Assessment/Plan: 22 y.o. G2P2002 postpartum day # 2  1. Continue routine postpartum care -Up to shower today to remove current dressing  -Replaced with OP site  -Instructed to leave on for 7 days  -Will need PP lovenox for 3 weeks   2. Infant feeding status: formula feeding -Encouraged snug fitting bra, cold application, Tylenol PRN, and cabbage leaves for engorgement for formula feeding   3. Contraception plan: IUD - Liletta   4. Acute blood loss anemia - clinically significant.  -Hemodynamically stable and asymptomatic -Intervention: start on oral supplementation with ferrous sulfate 325 mg   5. Immunization status:   all immunizations up to date  Disposition: desires discharge home today    LOS: 2 days   Denise [name], CNM 03/31/2023, 9:35 AM   ----- Denise [name]  Certified Nurse Midwife [facility]

## 2023-03-31 NOTE — Op Note (Signed)
Denise [name], SIEGRIST MEDICAL RECORD NO: [id number] ACCOUNT NO: [id number] DATE OF BIRTH: [birth year] FACILITY: [facility] LOCATION: [facility] PHYSICIAN: Suzy Bouchard, MD  Operative Report   DATE OF PROCEDURE: 03/29/2023  PREOPERATIVE DIAGNOSIS:   1.  39+1 weeks estimated gestational age. 2.  Elective repeat cesarean section.  POSTOPERATIVE DIAGNOSIS: 1.  39+1 weeks estimated gestational age. 2.  Elective repeat cesarean section. 3.  Vigorous female, delivered.  PROCEDURE:  Repeat low transverse cesarean section.  ANESTHESIA:  Spinal.  SURGEON:  Suzy Bouchard, MD  FIRST ASSISTANT:  Certified nurse midwife, Donato Schultz.  INDICATIONS:  A 22 year old gravida 2, para 1, patient with a history of a prior cesarean section was scheduled for repeat low transverse cesarean section on 03/30/2023.  The patient presented to the hospital earlier with contractions and showed  intermittent late decelerations.  AFI index was performed and measured 5.3 cm with an adequate pocket.  Given the patient was scheduled for cesarean section and the intermittent decelerations noted earlier in the day the patient and the surgeon elected  to proceed on with surgery today.  DESCRIPTION OF PROCEDURE:  After adequate spinal anesthesia, the patient was placed in dorsal supine position, hip roll on the right side.  The patient's abdomen was prepped and draped in normal sterile fashion.  The patient did receive 3 grams IV Ancef  for surgical prophylaxis.  Timeout was performed.  A Pfannenstiel incision was made 2 fingerbreadths above the symphysis pubis.  Sharp dissection was used to identify the fascia.  Fascia was opened in the midline and opened in transverse fashion.   Superior aspect of fascia was grasped with Kocher clamps and the recti muscles were dissected free.  Inferior aspect of the fascia was grasped with Kocher clamps and pyramidalis muscle was dissected free.  Entry into the  peritoneal cavity was  accomplished sharply.  Vesicouterine peritoneal fold was identified and opened and a bladder flap was created with the bladder was reflected inferiorly.  Low transverse uterine incision was made.  Upon entry into the endometrial cavity, clear fluid  resulted.  The incision was extended with blunt transverse traction.  Fetal head was brought to the incision with fundal pressure. Head was delivered and a loose nuchal cord was reduced and the shoulders and body were delivered without difficulty.   Delayed cord clamping for 60 seconds while the baby was dried on the mother's abdomen.  Vigorous female was then passed to nursery staff to assign Apgar scores of 8 and 9, fetal weight 7 pounds 1 ounce.  Placenta was manually delivered and the uterus was  exteriorized and the endometrial cavity was wiped clean with laparotomy tape.  Uterine incision was then closed with 1 chromic suture in a running locking fashion good approximation of edges.  One additional figure-of-eight sutures required for good  hemostasis.  Posterior cul-de-sac was irrigated and suctioned and the uterus was placed back into the abdominal cavity.  The pericolic gutters were wiped clean with laparotomy tape.  No additional bleeding noted at the uterine incision.  Interceed was  placed over the uterine incision in T-shape fashion.  The fascia was then closed with 0 Vicryl suture running nonlocking sutures used.  Good hemostasis was noted.  Good approximation of tissues.  The fascial edges were injected with a solution of 60 mL  of 0.25% Marcaine plus 20 mL Marcaine, approximately 40 mL of the solution was injected.  Subcutaneous tissues were irrigated and bovied and the skin was reapproximated with Insorb  absorbable staples with good cosmetic effect.  Additional 30 mL of  Marcaine solution injected beneath the skin.  There were no complications.  ESTIMATED BLOOD LOSS:  500 mL.  URINE OUTPUT:  10 mL.  INTRAOPERATIVE  FLUIDS:  200 mL.  The patient did receive 30 mg intravenous Toradol at the end of the procedure and was taken to recovery room in good condition.   PUS D: 03/29/2023 7:05:01 pm T: 03/29/2023 8:00:00 pm  JOB: [id number]/ [id number]

## 2023-03-31 NOTE — Progress Notes (Signed)
Infant discharged home with parents.  Discharge instructions and appointments given to parents.  Parents verbalized understanding.  All testing complete. Tag removed, bands matched, car seat present.  Will be escorted by auxiliary.   

## 2023-03-31 NOTE — Discharge Instructions (Signed)
Cesarean Delivery, Care After Refer to this sheet in the next few weeks. These instructions provide you with information on caring for yourself after your procedure. Your health care provider may also give you specific instructions. Your treatment has been planned according to current medical practices, but problems sometimes occur. Call your health care provider if you have any problems or questions after you go home. HOME CARE INSTRUCTIONS  Please leave honey comb dressing (OP Site) on for 7 days.  You may shower during this period but turn your back to the water so that the dressing does not get directly saturated by the water.   You may take the dressing off on day 7.  The easiest way to do it is in the shower.  Allow the water to run over the dressing and it usually comes off easier.   Only take over-the-counter or prescription medications as directed by your health care provider. Do not drink alcohol, especially if you are breastfeeding or taking medication to relieve pain. Do not  smoke tobacco. Continue to use good perineal care. Good perineal care includes: Wiping your perineum from front to back. Keeping your perineum clean. Check your surgical cut (incision) daily for increased redness, drainage, swelling, or separation of skin. Shower and clean your incision gently with soap and water every day, by letting warm and soapy water run over the incision, and then pat it dry. If your health care provider says it is okay, leave the incision uncovered. Use a bandage (dressing) if the incision is draining fluid or appears irritated. If the adhesive strips across the incision do not fall off within 7 days, carefully peel them off, after a shower. Hug a pillow when coughing or sneezing until your incision is healed. This helps to relieve pain. Do not use tampons, douches or have sexual intercourse, until your health care provider says it is okay. Wear a well-fitting bra that provides breast  support. Limit wearing support panties or control-top hose. Drink enough fluids to keep your urine clear or pale yellow. Eat high-fiber foods such as whole grain cereals and breads, brown rice, beans, and fresh fruits and vegetables every day. These foods may help prevent or relieve constipation. Resume activities such as climbing stairs, driving, lifting, exercising, or traveling as directed by your health care provider. Try to have someone help you with your household activities and your newborn for at least a few days after you leave the hospital. Rest as much as possible. Try to rest or take a nap when your newborn is sleeping. Increase your activities gradually. Do not lift more than 15lbs until directed by a provider. Keep all of your scheduled postpartum appointments. It is very important to keep your scheduled follow-up appointments. At these appointments, your health care provider will be checking to make sure that you are healing physically and emotionally. SEEK MEDICAL CARE IF:  You are passing large clots from your vagina. Save any clots to show your health care provider. You have a foul smelling discharge from your vagina. You have trouble urinating. You are urinating frequently. You have pain when you urinate. You have a change in your bowel movements. You have increasing redness, pain, or swelling near your incision. You have pus draining from your incision. Your incision is separating. You have painful, hard, or reddened breasts. You have a severe headache. You have blurred vision or see spots. You feel sad or depressed. You have thoughts of hurting yourself or your newborn. You have questions   about your care, the care of your newborn, or medications. You are dizzy or light-headed. You have a rash. You have pain, redness, or swelling at the site of the removed intravenous access (IV) tube. You have nausea or vomiting. You stopped breastfeeding and have not had a  menstrual period within 12 weeks of stopping. You are not breastfeeding and have not had a menstrual period within 12 weeks of delivery. You have a fever. SEEK IMMEDIATE MEDICAL CARE IF: You have persistent pain. You have chest pain. You have shortness of breath. You faint. You have leg pain. You have stomach pain. Your vaginal bleeding saturates 2 or more sanitary pads in 1 hour. MAKE SURE YOU:  Understand these instructions. Will watch your condition. Will get help right away if you are not doing well or get worse. Document Released: 02/27/2002 Document Revised: 10/22/2013 Document Reviewed: 02/02/2012 Exit[employer] Patient Information 2015 [employer]. This information is not intended to replace advice given to you by your health care provider. Make sure you discuss any questions you have with your health care provider.  

## 2023-07-28 ENCOUNTER — Other Ambulatory Visit: Payer: Self-pay

## 2023-07-28 ENCOUNTER — Emergency Department
Admission: EM | Admit: 2023-07-28 | Discharge: 2023-07-28 | Discharge status: Against Medical Advice | Payer: Medicaid Other | Attending: Physician Assistant | Admitting: Physician Assistant

## 2023-07-28 ENCOUNTER — Encounter: Payer: Self-pay | Admitting: Intensive Care

## 2023-07-28 DIAGNOSIS — R519 Headache, unspecified: Secondary | ICD-10-CM | POA: Diagnosis present

## 2023-07-28 DIAGNOSIS — Z20822 Contact with and (suspected) exposure to covid-19: Secondary | ICD-10-CM | POA: Diagnosis not present

## 2023-07-28 DIAGNOSIS — Z5321 Procedure and treatment not carried out due to patient leaving prior to being seen by health care provider: Secondary | ICD-10-CM | POA: Diagnosis not present

## 2023-07-28 DIAGNOSIS — Y92410 Unspecified street and highway as the place of occurrence of the external cause: Secondary | ICD-10-CM | POA: Insufficient documentation

## 2023-07-28 DIAGNOSIS — R509 Fever, unspecified: Secondary | ICD-10-CM | POA: Diagnosis not present

## 2023-07-28 DIAGNOSIS — R0981 Nasal congestion: Secondary | ICD-10-CM | POA: Diagnosis not present

## 2023-07-28 LAB — RESP PANEL BY RT-PCR (RSV, FLU A&B, COVID)  RVPGX2
Influenza A by PCR: POSITIVE — AB
Influenza B by PCR: NEGATIVE
Resp Syncytial Virus by PCR: NEGATIVE
SARS Coronavirus 2 by RT PCR: NEGATIVE
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
## 2023-07-29 ENCOUNTER — Ambulatory Visit
Admission: RE | Admit: 2023-07-29 | Discharge: 2023-07-29 | Disposition: A | Discharge status: Home / Self Care | Payer: Medicaid Other | Source: Ambulatory Visit

## 2023-07-29 VITALS — BP 122/85 | HR 78 | Temp 98.6°F | Resp 18

## 2023-07-29 DIAGNOSIS — M25552 Pain in left hip: Secondary | ICD-10-CM

## 2023-07-29 DIAGNOSIS — J101 Influenza due to other identified influenza virus with other respiratory manifestations: Secondary | ICD-10-CM | POA: Diagnosis not present

## 2023-07-29 DIAGNOSIS — M25562 Pain in left knee: Secondary | ICD-10-CM | POA: Diagnosis not present

## 2023-07-29 NOTE — Discharge Instructions (Addendum)
 Take Tylenol  or ibuprofen  as needed for fever or discomfort.  Rest and keep yourself hydrated.    Follow up with your primary care provider on Monday.  Go to the emergency department if you have worsening symptoms.
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
## 2023-09-02 IMAGING — US US MFM FETAL BPP W/O NON-STRESS
1 series · 14 of 28 positions shown · non-contrast
Comparison: none

[Series 1: us mfm fetal bpp w/o non-stress · 36 acquisitions, 14 frames shown]
[im 2/36]
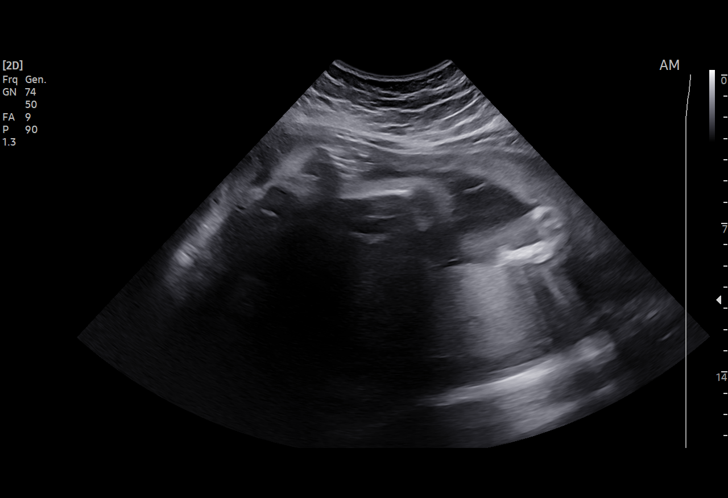
[im 4/36]
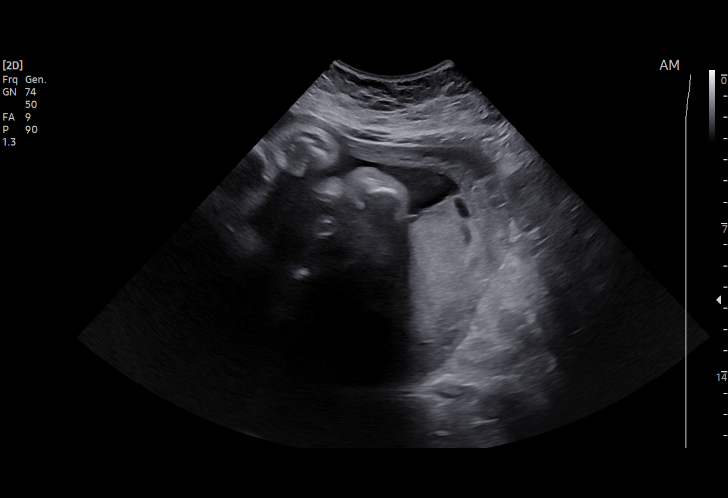
[im 7/36]
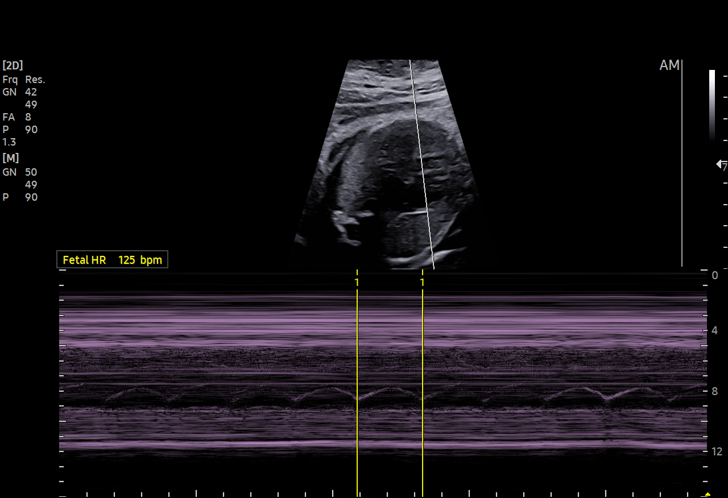
[im 10/36]
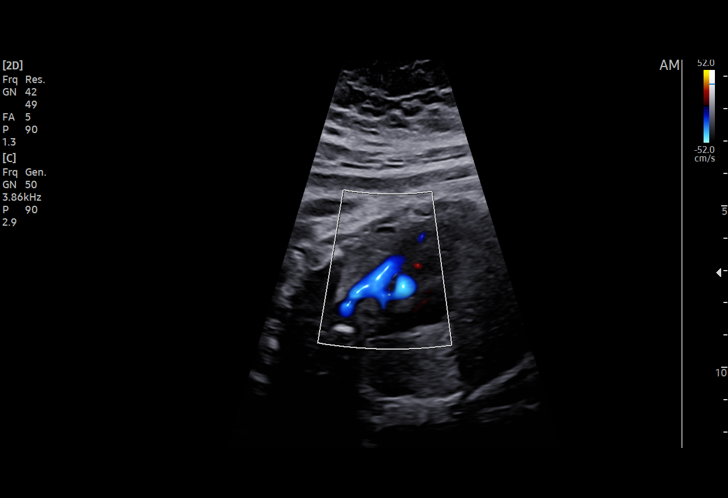
[im 12/36]
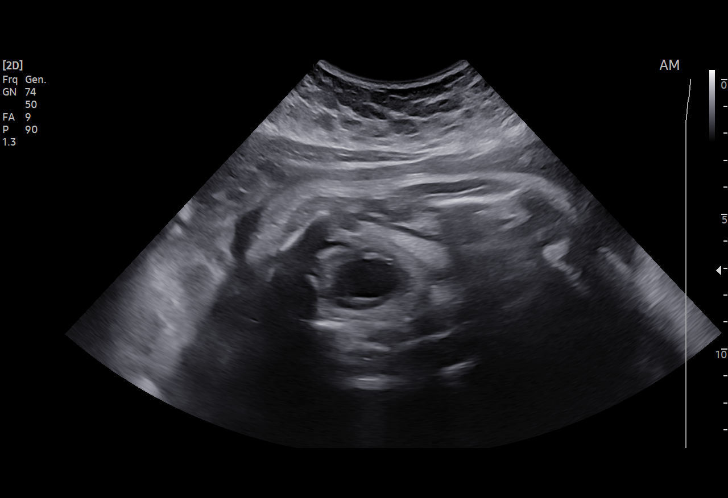
[im 15/36]
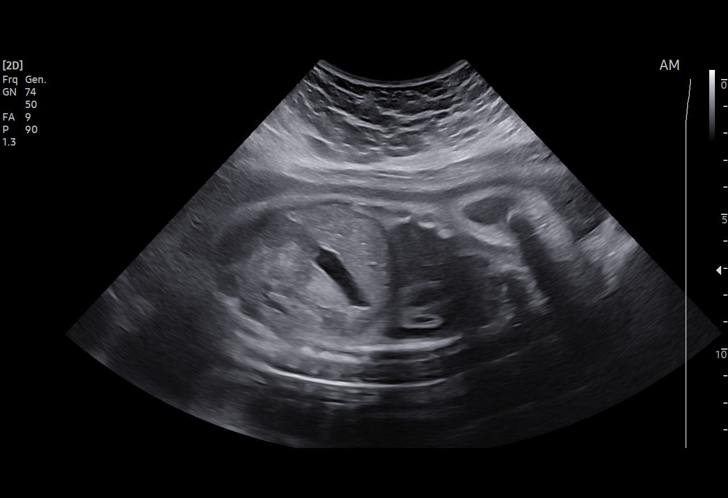
[im 17/36]
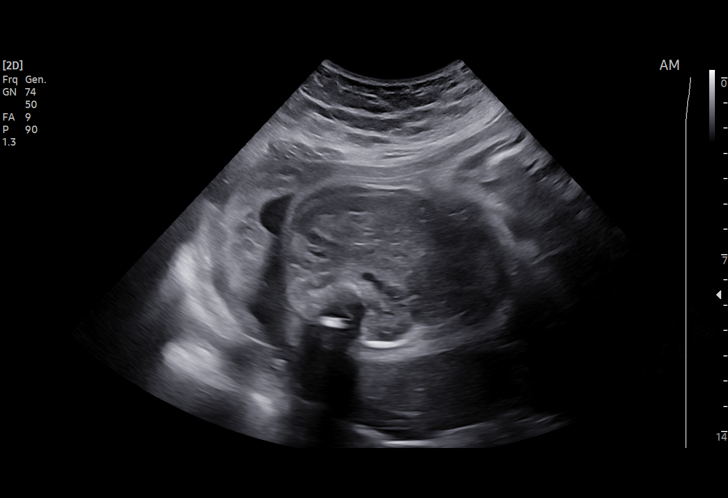
[im 20/36]
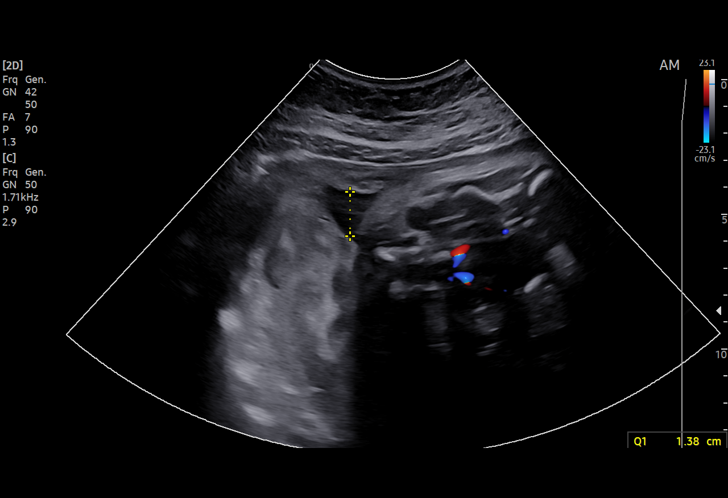
[im 23/36]
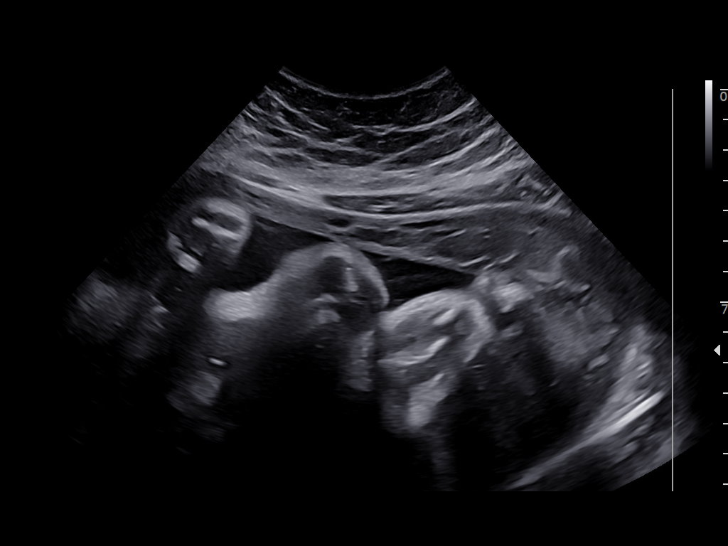
[im 25/36]
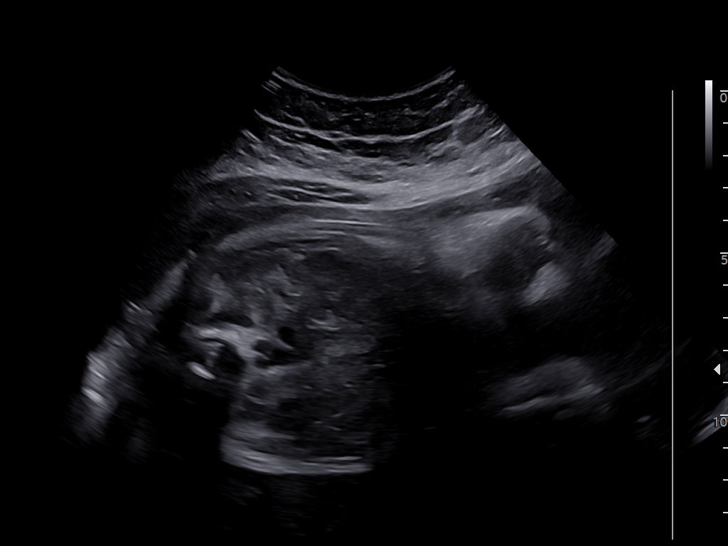
[im 28/36]
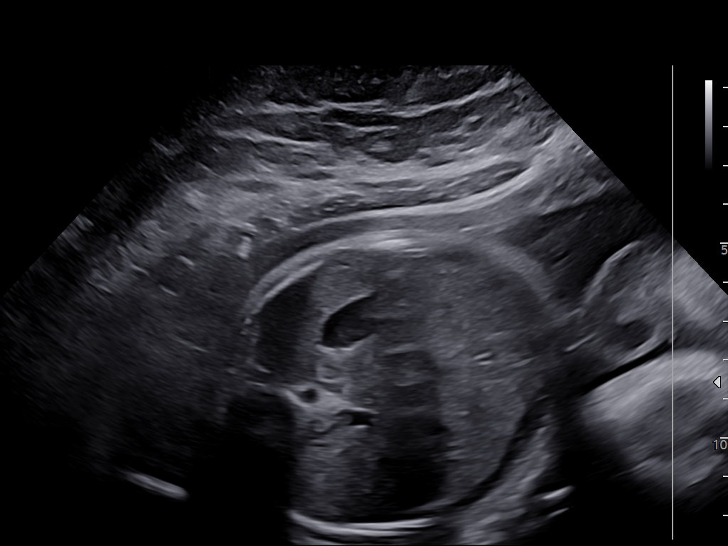
[im 30/36]
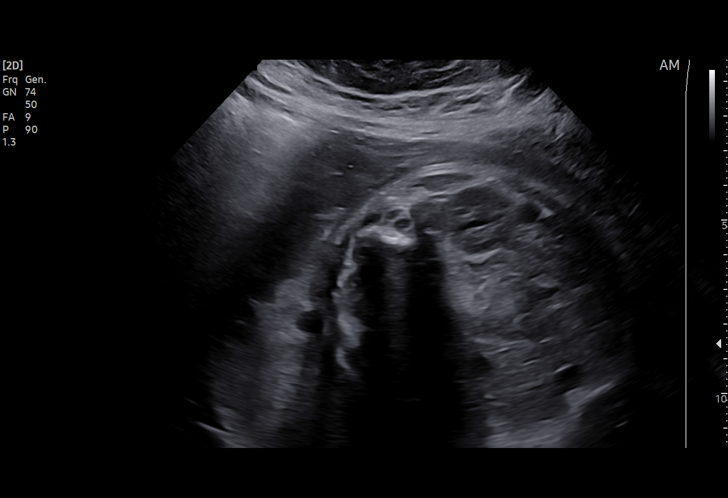
[im 33/36]
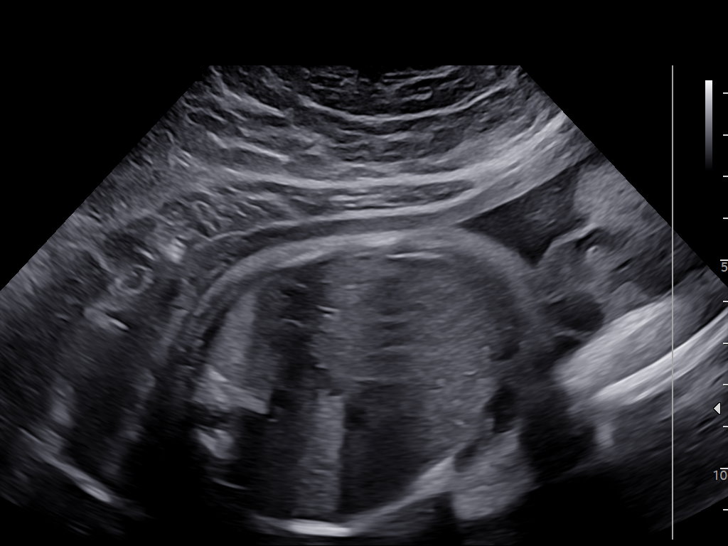
[im 36/36]
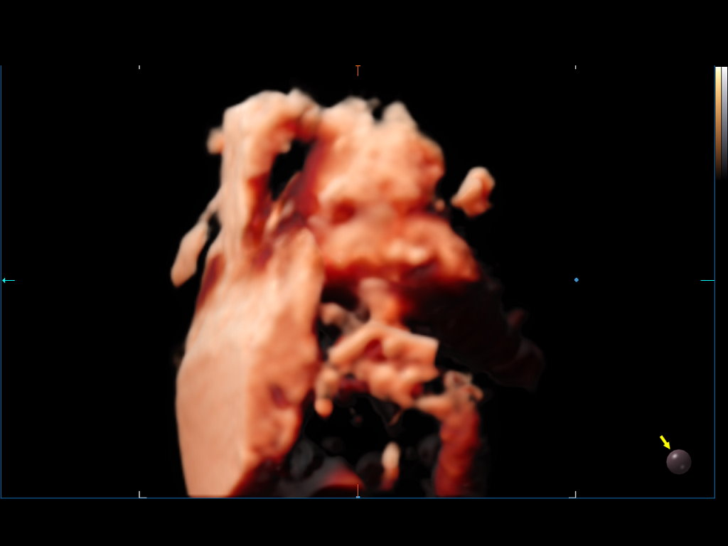

[14 of 28 positions shown; findings below may reference images not displayed]

Indications

 33 weeks gestation of pregnancy
 Hypertension - Chronic/Pre-existing (ASA)
 Obesity complicating pregnancy, third
 trimester (BMI:>50%)
 Abnormal finding on antenatal screening
 (DbBGX:6 )
 NaXNU:Low risk, XX, FF:4%
Fetal Evaluation

 Num Of Fetuses:         1
 Fetal Heart Rate(bpm):  125
 Cardiac Activity:       Observed
 Presentation:           Cephalic
 Placenta:               Posterior
 P. Cord Insertion:      Previously Visualized

 Amniotic Fluid
 AFI FV:      Within normal limits

 AFI Sum(cm)     %Tile       Largest Pocket(cm)


 RUQ(cm)       RLQ(cm)       LUQ(cm)        LLQ(cm)

Biophysical Evaluation

 Amniotic F.V:   Within normal limits       F. Tone:        Observed
 F. Movement:    Observed                   Score:          [DATE]
 F. Breathing:   Observed
OB History

 Gravidity:    1
Gestational Age

 LMP:           33w 1d        Date:  04/01/21                 EDD:   01/06/22
 Best:          33w 1d     Det. By:  LMP  (04/01/21)          EDD:   01/06/22
Anatomy

 Heart:                 Appears normal         Abdomen:                Appears normal
                        (4CH, axis, and
                        situs)
 Diaphragm:             Appears normal         Kidneys:                Appear normal
 Stomach:               Appears normal, left   Bladder:                Appears normal
                        sided
Cervix Uterus Adnexa

 Cervix
 Not visualized (advanced GA >99wks)

 Uterus
 No abnormality visualized.

 Right Ovary
 Not visualized.

 Left Ovary
 Not visualized.
Impression

 Antenatal testing performed given maternal chronic
 hypertension without medical therapy.
 The biophysical profile was [DATE] with good fetal movement and
 amniotic fluid volume.
Recommendations

 Continue weekly testing as previously recommended.

## 2023-09-09 IMAGING — US US MFM FETAL BPP W/ NON-STRESS
1 series · 14 of 28 positions shown · non-contrast
Comparison: none

[Series 1: us mfm fetal bpp w/ non-stress · 52 acquisitions, 14 frames shown]
[im 2/52]
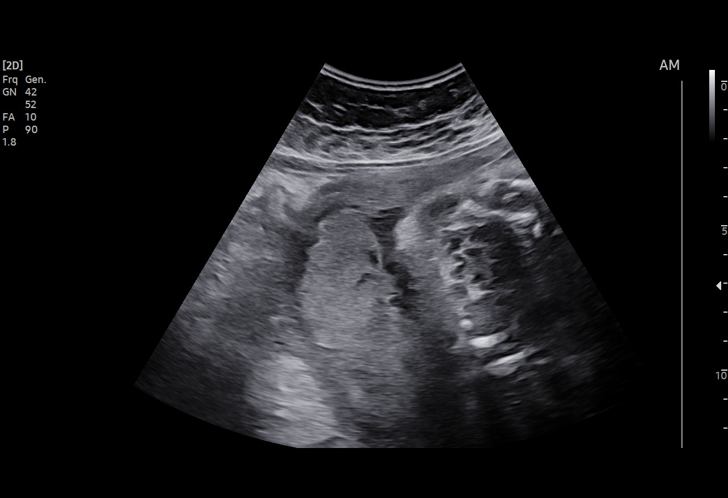
[im 6/52]
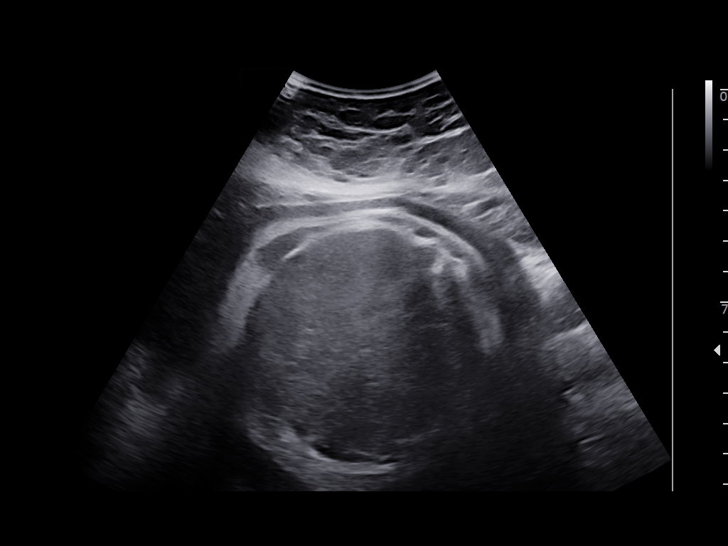
[im 10/52]
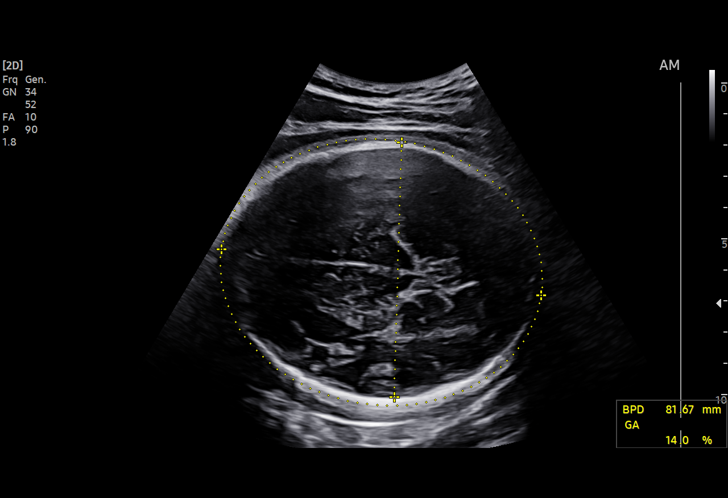
[im 14/52]
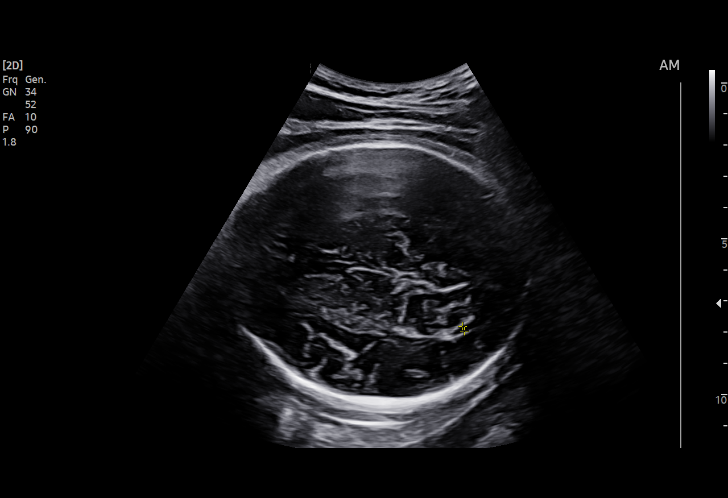
[im 18/52]
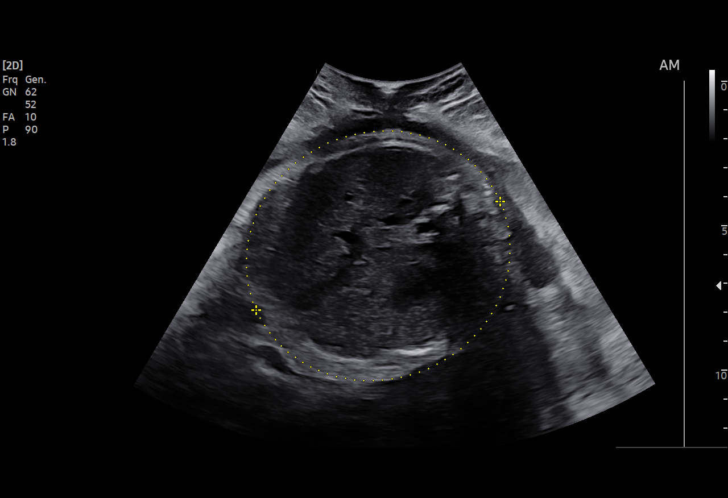
[im 21/52]
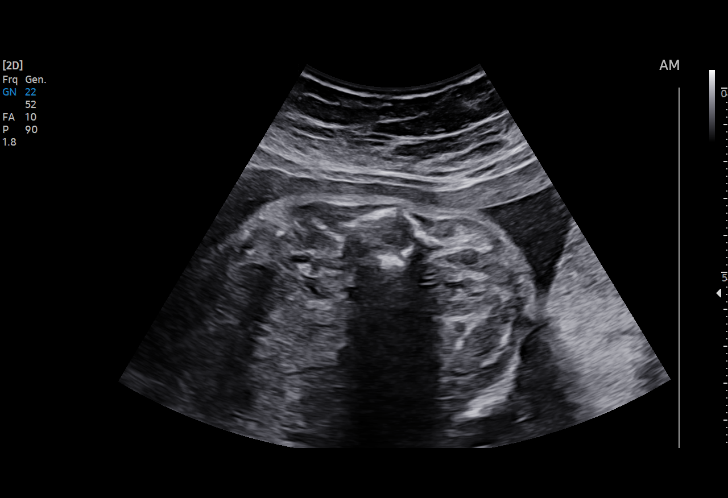
[im 25/52]
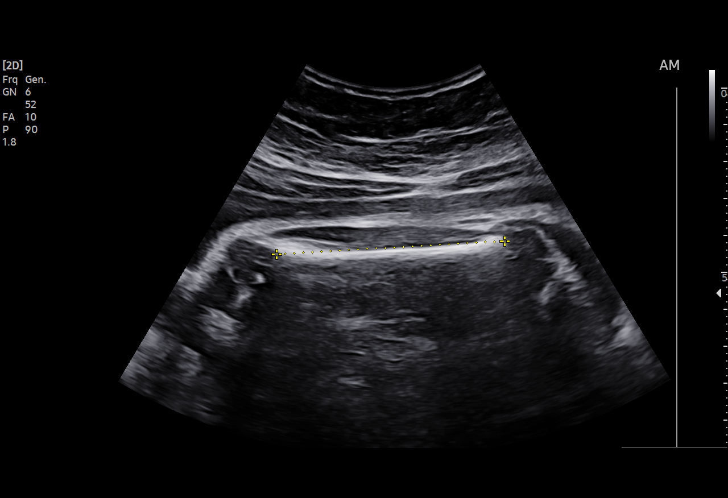
[im 29/52]
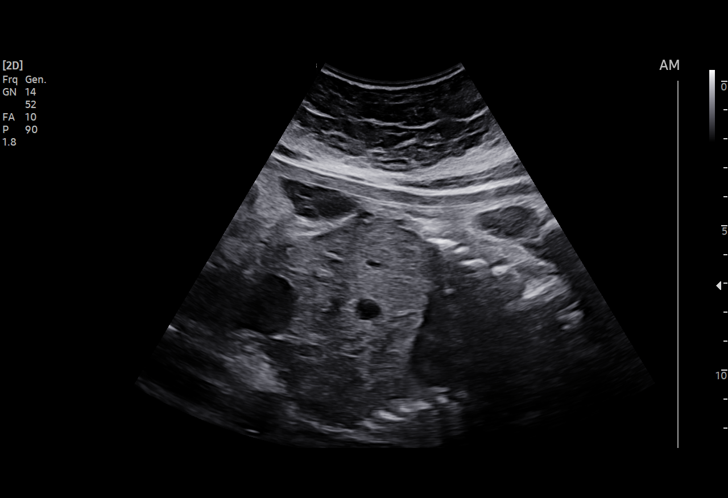
[im 33/52]
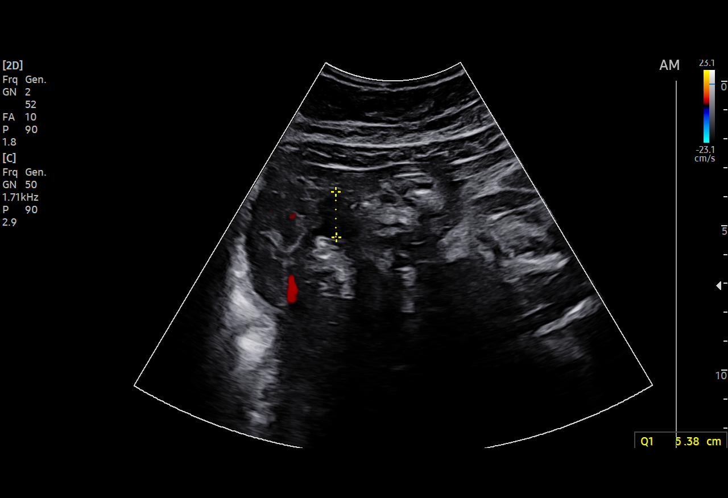
[im 36/52]
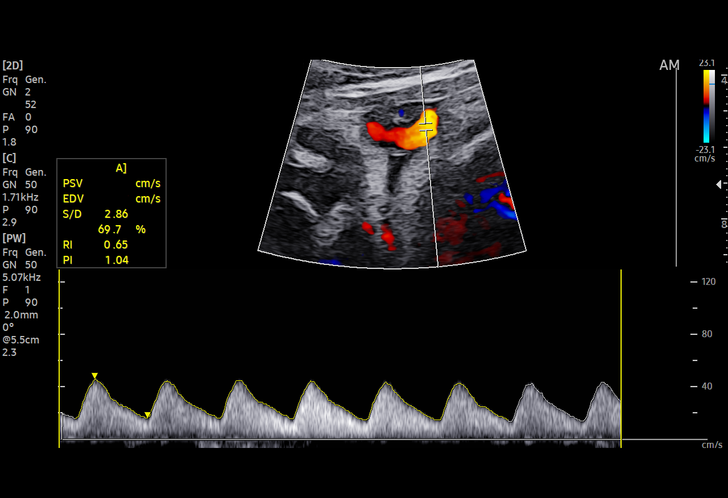
[im 40/52]
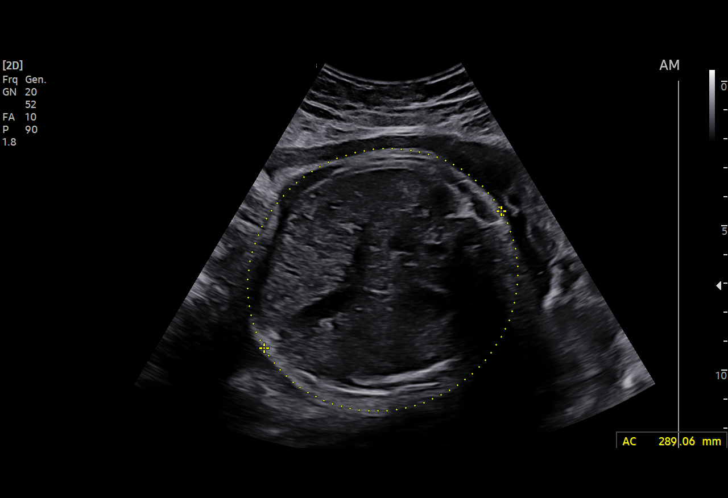
[im 44/52]
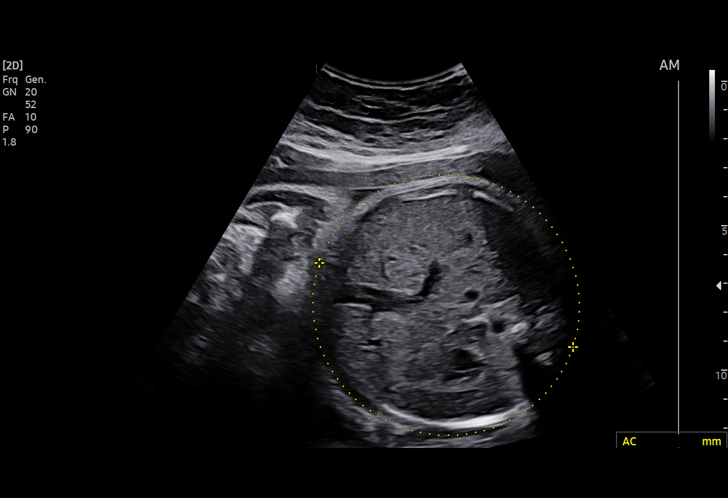
[im 48/52]
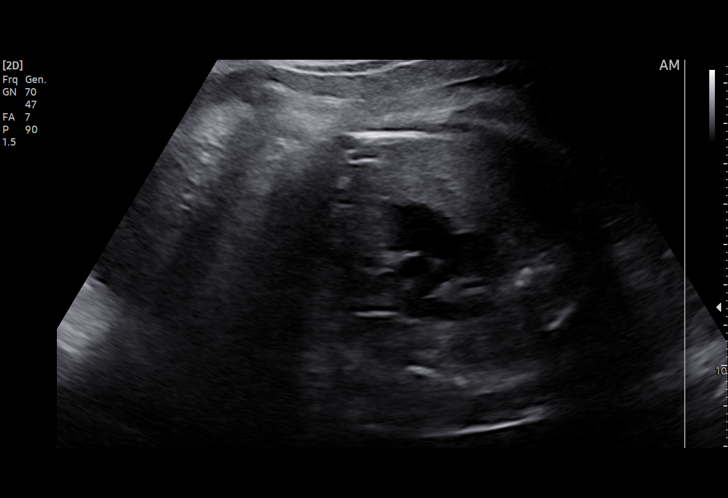
[im 52/52]
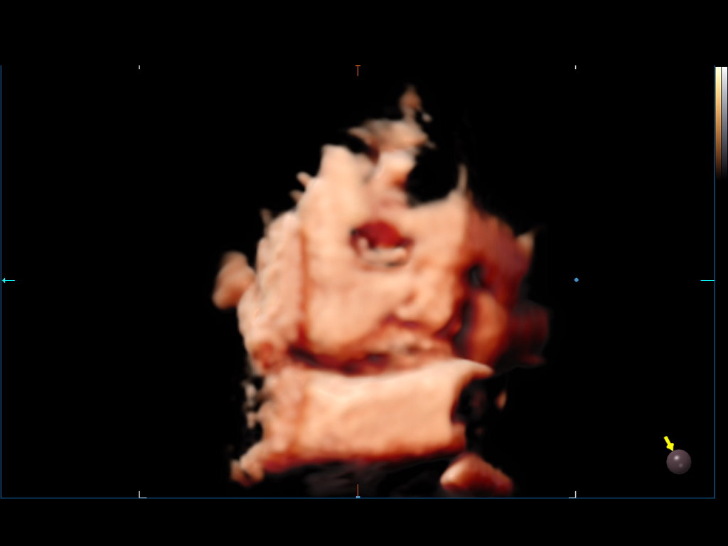

[14 of 28 positions shown; findings below may reference images not displayed]

GIORGI

                                                      VOORHEES
    NAMUNE/NONSTRESS                                       VOORHEES

Indications

 Obesity complicating pregnancy, third
 trimester (BMI:>50%)
 Hypertension - Chronic/Pre-existing (ASA)
 34 weeks gestation of pregnancy
 Abnormal finding on antenatal screening
 (TbNCV:6 )
 3a1BD:Low risk, XX, FF:4%
Vital Signs

 BP:          125/67
Fetal Evaluation

 Num Of Fetuses:         1
 Fetal Heart Rate(bpm):  133
 Cardiac Activity:       Observed
 Presentation:           Cephalic
 Placenta:               Posterior
 P. Cord Insertion:      Previously Visualized

 Amniotic Fluid
 AFI FV:      Within normal limits

 AFI Sum(cm)     %Tile       Largest Pocket(cm)
 10.82           25

 RUQ(cm)       RLQ(cm)       LUQ(cm)        LLQ(cm)

Biophysical Evaluation

 Amniotic F.V:   Within normal limits       F. Tone:        Observed
 F. Movement:    Observed                   N.S.T:          Reactive
 F. Breathing:   Not Observed               Score:          [DATE]
Biometry

 BPD:     81.72  mm     G. Age:  32w 6d         14  %    CI:        74.86   %    70 - 86
                                                         FL/HC:      20.9   %    19.4 -
 HC:    299.69   mm     G. Age:  33w 2d        4.7  %    HC/AC:      1.04        0.96 -
 AC:    288.69   mm     G. Age:  32w 6d         19  %    FL/BPD:     76.8   %    71 - 87
 FL:      62.77  mm     G. Age:  32w 3d          8  %    FL/AC:      21.7   %    20 - 24
 LV:        1.6  mm

 Est. FW:    2162  gm      4 lb 8 oz     12  %
OB History

 Gravidity:    1
Gestational Age

 LMP:           34w 1d        Date:  04/01/21                 EDD:   01/06/22
 U/S Today:     32w 6d                                        EDD:   01/15/22
 Best:          34w 1d     Det. By:  LMP  (04/01/21)          EDD:   01/06/22
Anatomy

 Cranium:               Appears normal         Diaphragm:              Appears normal
 Cavum:                 Appears normal         Stomach:                Appears normal, left
                                                                       sided
 Ventricles:            Appears normal         Abdomen:                Appears normal
 Heart:                 Appears normal         Kidneys:                Appear normal
                        (4CH, axis, and
                        situs)
 RVOT:                  Appears normal         Bladder:                Appears normal
 LVOT:                  Appears normal
Cervix Uterus Adnexa

 Cervix
 Not visualized (advanced GA >37wks)

 Uterus
 No abnormality visualized.

 Right Ovary
 Not visualized.

 Left Ovary
 Not visualized.
Comments
 This patient was seen for a follow up growth scan due to
 maternal obesity and chronic hypertension.  She denies any
 problems since her last exam.
  The overall EFW measured at the 12th percentile today.
 There was normal amniotic fluid noted.
 A BPP performed today was [DATE] with a reactive NST.
 She received a -2 for fetal breathing movements that did not
 meet criteria.
 She will return in 1 week for another BPP.

## 2023-09-23 ENCOUNTER — Other Ambulatory Visit

## 2023-09-26 ENCOUNTER — Other Ambulatory Visit

## 2023-12-18 ENCOUNTER — Ambulatory Visit

## 2024-01-17 ENCOUNTER — Other Ambulatory Visit

## 2024-01-19 ENCOUNTER — Other Ambulatory Visit

## 2024-01-31 ENCOUNTER — Ambulatory Visit (LOCAL_COMMUNITY_HEALTH_CENTER)

## 2024-01-31 DIAGNOSIS — Z111 Encounter for screening for respiratory tuberculosis: Secondary | ICD-10-CM

## 2024-01-31 NOTE — Progress Notes (Signed)
 Tuberculin skin test applied to left ventral forearm.  Explained not to use lotions or creams, place bandages or wraps on testing area.  Advised, if bleeding occurs, to lightly dab testing area and given 2x2 gauze in case of leakage or bleeding.  Kandi KATHEE Glatter, RN

## 2024-02-02 ENCOUNTER — Ambulatory Visit

## 2024-02-02 DIAGNOSIS — Z111 Encounter for screening for respiratory tuberculosis: Secondary | ICD-10-CM

## 2024-02-02 LAB — TB SKIN TEST
Induration: 0 mm
TB Skin Test: NEGATIVE
